# Patient Record
Sex: Female | Born: 1935 | Race: White | Hispanic: No | Smoking: Former smoker
Health system: Southern US, Community
[De-identification: ages and names within clinical notes are randomized; demographics above are authoritative.]

## PROBLEM LIST (undated history)

## (undated) DIAGNOSIS — H409 Unspecified glaucoma: Secondary | ICD-10-CM

## (undated) DIAGNOSIS — D649 Anemia, unspecified: Secondary | ICD-10-CM

## (undated) DIAGNOSIS — E079 Disorder of thyroid, unspecified: Secondary | ICD-10-CM

## (undated) DIAGNOSIS — I739 Peripheral vascular disease, unspecified: Secondary | ICD-10-CM

## (undated) HISTORY — PX: THYROID SURGERY: SHX805

## (undated) HISTORY — PX: EYE SURGERY: SHX253

## (undated) HISTORY — PX: CARDIAC SURGERY: SHX584

## (undated) HISTORY — PX: HIP SURGERY: SHX245

---

## 2019-04-01 ENCOUNTER — Encounter: Payer: Self-pay | Admitting: Emergency Medicine

## 2019-04-01 ENCOUNTER — Other Ambulatory Visit: Payer: Self-pay

## 2019-04-01 ENCOUNTER — Emergency Department: Payer: Medicare Other

## 2019-04-01 DIAGNOSIS — Z79890 Hormone replacement therapy (postmenopausal): Secondary | ICD-10-CM

## 2019-04-01 DIAGNOSIS — F419 Anxiety disorder, unspecified: Secondary | ICD-10-CM | POA: Diagnosis present

## 2019-04-01 DIAGNOSIS — D509 Iron deficiency anemia, unspecified: Secondary | ICD-10-CM | POA: Diagnosis present

## 2019-04-01 DIAGNOSIS — I10 Essential (primary) hypertension: Secondary | ICD-10-CM | POA: Diagnosis present

## 2019-04-01 DIAGNOSIS — Z91018 Allergy to other foods: Secondary | ICD-10-CM

## 2019-04-01 DIAGNOSIS — Z7951 Long term (current) use of inhaled steroids: Secondary | ICD-10-CM

## 2019-04-01 DIAGNOSIS — Z96642 Presence of left artificial hip joint: Secondary | ICD-10-CM | POA: Diagnosis present

## 2019-04-01 DIAGNOSIS — S72115A Nondisplaced fracture of greater trochanter of left femur, initial encounter for closed fracture: Secondary | ICD-10-CM | POA: Diagnosis not present

## 2019-04-01 DIAGNOSIS — I739 Peripheral vascular disease, unspecified: Secondary | ICD-10-CM | POA: Diagnosis present

## 2019-04-01 DIAGNOSIS — Z955 Presence of coronary angioplasty implant and graft: Secondary | ICD-10-CM

## 2019-04-01 DIAGNOSIS — N3281 Overactive bladder: Secondary | ICD-10-CM | POA: Diagnosis present

## 2019-04-01 DIAGNOSIS — W010XXA Fall on same level from slipping, tripping and stumbling without subsequent striking against object, initial encounter: Secondary | ICD-10-CM | POA: Diagnosis present

## 2019-04-01 DIAGNOSIS — D638 Anemia in other chronic diseases classified elsewhere: Secondary | ICD-10-CM | POA: Diagnosis present

## 2019-04-01 DIAGNOSIS — Z7401 Bed confinement status: Secondary | ICD-10-CM

## 2019-04-01 DIAGNOSIS — Z87891 Personal history of nicotine dependence: Secondary | ICD-10-CM

## 2019-04-01 DIAGNOSIS — F0390 Unspecified dementia without behavioral disturbance: Secondary | ICD-10-CM | POA: Diagnosis present

## 2019-04-01 DIAGNOSIS — Z20828 Contact with and (suspected) exposure to other viral communicable diseases: Secondary | ICD-10-CM | POA: Diagnosis present

## 2019-04-01 DIAGNOSIS — J449 Chronic obstructive pulmonary disease, unspecified: Secondary | ICD-10-CM | POA: Diagnosis present

## 2019-04-01 DIAGNOSIS — H409 Unspecified glaucoma: Secondary | ICD-10-CM | POA: Diagnosis present

## 2019-04-01 DIAGNOSIS — S32592A Other specified fracture of left pubis, initial encounter for closed fracture: Secondary | ICD-10-CM | POA: Diagnosis present

## 2019-04-01 DIAGNOSIS — M9702XA Periprosthetic fracture around internal prosthetic left hip joint, initial encounter: Secondary | ICD-10-CM | POA: Diagnosis present

## 2019-04-01 DIAGNOSIS — E039 Hypothyroidism, unspecified: Secondary | ICD-10-CM | POA: Diagnosis present

## 2019-04-01 DIAGNOSIS — K219 Gastro-esophageal reflux disease without esophagitis: Secondary | ICD-10-CM | POA: Diagnosis present

## 2019-04-01 DIAGNOSIS — Z7902 Long term (current) use of antithrombotics/antiplatelets: Secondary | ICD-10-CM

## 2019-04-01 DIAGNOSIS — H9190 Unspecified hearing loss, unspecified ear: Secondary | ICD-10-CM | POA: Diagnosis present

## 2019-04-01 DIAGNOSIS — Z79899 Other long term (current) drug therapy: Secondary | ICD-10-CM

## 2019-04-01 DIAGNOSIS — Z881 Allergy status to other antibiotic agents status: Secondary | ICD-10-CM

## 2019-04-01 MED ORDER — OXYCODONE HCL 5 MG PO TABS
5.0000 mg | ORAL_TABLET | Freq: Once | ORAL | Status: AC
  Administered 2019-04-01: 5 mg via ORAL
  Filled 2019-04-01: qty 1

## 2019-04-01 NOTE — ED Triage Notes (Signed)
Patient states that she lost her balance and fell on her left hip. Patient with complaint of pain to left hip with movement.

## 2019-04-02 ENCOUNTER — Other Ambulatory Visit: Payer: Self-pay

## 2019-04-02 ENCOUNTER — Inpatient Hospital Stay: Payer: Medicare Other

## 2019-04-02 ENCOUNTER — Emergency Department: Payer: Medicare Other

## 2019-04-02 ENCOUNTER — Inpatient Hospital Stay
Admission: EM | Admit: 2019-04-02 | Discharge: 2019-04-05 | DRG: 964 | Disposition: A | Discharge status: Home Health | Payer: Medicare Other | Attending: Internal Medicine | Admitting: Internal Medicine

## 2019-04-02 DIAGNOSIS — K219 Gastro-esophageal reflux disease without esophagitis: Secondary | ICD-10-CM | POA: Diagnosis present

## 2019-04-02 DIAGNOSIS — R52 Pain, unspecified: Secondary | ICD-10-CM

## 2019-04-02 DIAGNOSIS — Z20828 Contact with and (suspected) exposure to other viral communicable diseases: Secondary | ICD-10-CM | POA: Diagnosis present

## 2019-04-02 DIAGNOSIS — D649 Anemia, unspecified: Secondary | ICD-10-CM

## 2019-04-02 DIAGNOSIS — W19XXXA Unspecified fall, initial encounter: Secondary | ICD-10-CM

## 2019-04-02 DIAGNOSIS — Z881 Allergy status to other antibiotic agents status: Secondary | ICD-10-CM | POA: Diagnosis not present

## 2019-04-02 DIAGNOSIS — J449 Chronic obstructive pulmonary disease, unspecified: Secondary | ICD-10-CM | POA: Diagnosis present

## 2019-04-02 DIAGNOSIS — Z955 Presence of coronary angioplasty implant and graft: Secondary | ICD-10-CM | POA: Diagnosis not present

## 2019-04-02 DIAGNOSIS — H409 Unspecified glaucoma: Secondary | ICD-10-CM

## 2019-04-02 DIAGNOSIS — F419 Anxiety disorder, unspecified: Secondary | ICD-10-CM

## 2019-04-02 DIAGNOSIS — S72002D Fracture of unspecified part of neck of left femur, subsequent encounter for closed fracture with routine healing: Secondary | ICD-10-CM | POA: Diagnosis not present

## 2019-04-02 DIAGNOSIS — E039 Hypothyroidism, unspecified: Secondary | ICD-10-CM

## 2019-04-02 DIAGNOSIS — S72002A Fracture of unspecified part of neck of left femur, initial encounter for closed fracture: Secondary | ICD-10-CM | POA: Diagnosis present

## 2019-04-02 DIAGNOSIS — Z96642 Presence of left artificial hip joint: Secondary | ICD-10-CM | POA: Diagnosis present

## 2019-04-02 DIAGNOSIS — W010XXA Fall on same level from slipping, tripping and stumbling without subsequent striking against object, initial encounter: Secondary | ICD-10-CM | POA: Diagnosis present

## 2019-04-02 DIAGNOSIS — S3282XA Multiple fractures of pelvis without disruption of pelvic ring, initial encounter for closed fracture: Secondary | ICD-10-CM

## 2019-04-02 DIAGNOSIS — S32592A Other specified fracture of left pubis, initial encounter for closed fracture: Secondary | ICD-10-CM | POA: Diagnosis present

## 2019-04-02 DIAGNOSIS — Z79890 Hormone replacement therapy (postmenopausal): Secondary | ICD-10-CM | POA: Diagnosis not present

## 2019-04-02 DIAGNOSIS — S72115A Nondisplaced fracture of greater trochanter of left femur, initial encounter for closed fracture: Secondary | ICD-10-CM | POA: Diagnosis present

## 2019-04-02 DIAGNOSIS — F0390 Unspecified dementia without behavioral disturbance: Secondary | ICD-10-CM | POA: Diagnosis present

## 2019-04-02 DIAGNOSIS — Z91018 Allergy to other foods: Secondary | ICD-10-CM | POA: Diagnosis not present

## 2019-04-02 DIAGNOSIS — R2689 Other abnormalities of gait and mobility: Secondary | ICD-10-CM

## 2019-04-02 DIAGNOSIS — N3281 Overactive bladder: Secondary | ICD-10-CM

## 2019-04-02 DIAGNOSIS — Z79899 Other long term (current) drug therapy: Secondary | ICD-10-CM | POA: Diagnosis not present

## 2019-04-02 DIAGNOSIS — Z7189 Other specified counseling: Secondary | ICD-10-CM | POA: Diagnosis not present

## 2019-04-02 DIAGNOSIS — I739 Peripheral vascular disease, unspecified: Secondary | ICD-10-CM | POA: Diagnosis present

## 2019-04-02 DIAGNOSIS — Z7902 Long term (current) use of antithrombotics/antiplatelets: Secondary | ICD-10-CM | POA: Diagnosis not present

## 2019-04-02 DIAGNOSIS — I10 Essential (primary) hypertension: Secondary | ICD-10-CM

## 2019-04-02 DIAGNOSIS — M9702XA Periprosthetic fracture around internal prosthetic left hip joint, initial encounter: Secondary | ICD-10-CM | POA: Diagnosis present

## 2019-04-02 DIAGNOSIS — H9190 Unspecified hearing loss, unspecified ear: Secondary | ICD-10-CM | POA: Diagnosis present

## 2019-04-02 DIAGNOSIS — R0902 Hypoxemia: Secondary | ICD-10-CM

## 2019-04-02 DIAGNOSIS — D638 Anemia in other chronic diseases classified elsewhere: Secondary | ICD-10-CM | POA: Diagnosis present

## 2019-04-02 DIAGNOSIS — Z7951 Long term (current) use of inhaled steroids: Secondary | ICD-10-CM | POA: Diagnosis not present

## 2019-04-02 DIAGNOSIS — D509 Iron deficiency anemia, unspecified: Secondary | ICD-10-CM | POA: Diagnosis present

## 2019-04-02 HISTORY — DX: Anemia, unspecified: D64.9

## 2019-04-02 HISTORY — DX: Peripheral vascular disease, unspecified: I73.9

## 2019-04-02 HISTORY — DX: Unspecified glaucoma: H40.9

## 2019-04-02 HISTORY — DX: Disorder of thyroid, unspecified: E07.9

## 2019-04-02 LAB — CBC WITH DIFFERENTIAL/PLATELET
Abs Immature Granulocytes: 0.08 10*3/uL — ABNORMAL HIGH (ref 0.00–0.07)
Basophils Absolute: 0 10*3/uL (ref 0.0–0.1)
Basophils Relative: 0 %
Eosinophils Absolute: 0.2 10*3/uL (ref 0.0–0.5)
Eosinophils Relative: 1 %
HCT: 33.2 % — ABNORMAL LOW (ref 36.0–46.0)
Hemoglobin: 10.1 g/dL — ABNORMAL LOW (ref 12.0–15.0)
Immature Granulocytes: 1 %
Lymphocytes Relative: 3 %
Lymphs Abs: 0.4 10*3/uL — ABNORMAL LOW (ref 0.7–4.0)
MCH: 23.6 pg — ABNORMAL LOW (ref 26.0–34.0)
MCHC: 30.4 g/dL (ref 30.0–36.0)
MCV: 77.6 fL — ABNORMAL LOW (ref 80.0–100.0)
Monocytes Absolute: 1 10*3/uL (ref 0.1–1.0)
Monocytes Relative: 9 %
Neutro Abs: 10 10*3/uL — ABNORMAL HIGH (ref 1.7–7.7)
Neutrophils Relative %: 86 %
Platelets: 241 10*3/uL (ref 150–400)
RBC: 4.28 MIL/uL (ref 3.87–5.11)
RDW: 21.1 % — ABNORMAL HIGH (ref 11.5–15.5)
Smear Review: NORMAL
WBC: 11.6 10*3/uL — ABNORMAL HIGH (ref 4.0–10.5)
nRBC: 0 % (ref 0.0–0.2)

## 2019-04-02 LAB — BASIC METABOLIC PANEL
Anion gap: 7 (ref 5–15)
Anion gap: 8 (ref 5–15)
BUN: 24 mg/dL — ABNORMAL HIGH (ref 8–23)
BUN: 24 mg/dL — ABNORMAL HIGH (ref 8–23)
CO2: 28 mmol/L (ref 22–32)
CO2: 26 mmol/L (ref 22–32)
Calcium: 8.5 mg/dL — ABNORMAL LOW (ref 8.9–10.3)
Calcium: 8.4 mg/dL — ABNORMAL LOW (ref 8.9–10.3)
Chloride: 104 mmol/L (ref 98–111)
Chloride: 106 mmol/L (ref 98–111)
Creatinine, Ser: 0.76 mg/dL (ref 0.44–1.00)
Creatinine, Ser: 0.81 mg/dL (ref 0.44–1.00)
GFR calc Af Amer: >60 mL/min (ref >60)
GFR calc Af Amer: >60 mL/min (ref >60)
GFR calc non Af Amer: >60 mL/min (ref >60)
GFR calc non Af Amer: >60 mL/min (ref >60)
Glucose, Bld: 138 mg/dL — ABNORMAL HIGH (ref 70–99)
Glucose, Bld: 120 mg/dL — ABNORMAL HIGH (ref 70–99)
Potassium: 5.4 mmol/L — ABNORMAL HIGH (ref 3.5–5.1)
Potassium: 4.4 mmol/L (ref 3.5–5.1)
Sodium: 139 mmol/L (ref 135–145)
Sodium: 140 mmol/L (ref 135–145)

## 2019-04-02 LAB — CBC
HCT: 32.2 % — ABNORMAL LOW (ref 36.0–46.0)
Hemoglobin: 9.5 g/dL — ABNORMAL LOW (ref 12.0–15.0)
MCH: 24 pg — ABNORMAL LOW (ref 26.0–34.0)
MCHC: 29.5 g/dL — ABNORMAL LOW (ref 30.0–36.0)
MCV: 81.3 fL (ref 80.0–100.0)
Platelets: 230 10*3/uL (ref 150–400)
RBC: 3.96 MIL/uL (ref 3.87–5.11)
RDW: 20.6 % — ABNORMAL HIGH (ref 11.5–15.5)
WBC: 10.5 10*3/uL (ref 4.0–10.5)
nRBC: 0 % (ref 0.0–0.2)

## 2019-04-02 LAB — COMPREHENSIVE METABOLIC PANEL
ALT: 15 U/L (ref 0–44)
AST: 22 U/L (ref 15–41)
Albumin: 3.8 g/dL (ref 3.5–5.0)
Alkaline Phosphatase: 115 U/L (ref 38–126)
Anion gap: 11 (ref 5–15)
BUN: 25 mg/dL — ABNORMAL HIGH (ref 8–23)
CO2: 23 mmol/L (ref 22–32)
Calcium: 8.7 mg/dL — ABNORMAL LOW (ref 8.9–10.3)
Chloride: 103 mmol/L (ref 98–111)
Creatinine, Ser: 0.74 mg/dL (ref 0.44–1.00)
GFR calc Af Amer: >60 mL/min (ref >60)
GFR calc non Af Amer: >60 mL/min (ref >60)
Glucose, Bld: 148 mg/dL — ABNORMAL HIGH (ref 70–99)
Potassium: 4.3 mmol/L (ref 3.5–5.1)
Sodium: 137 mmol/L (ref 135–145)
Total Bilirubin: 0.5 mg/dL (ref 0.3–1.2)
Total Protein: 6.8 g/dL (ref 6.5–8.1)

## 2019-04-02 LAB — GLUCOSE, CAPILLARY: Glucose-Capillary: 115 mg/dL — ABNORMAL HIGH (ref 70–99)

## 2019-04-02 LAB — SARS CORONAVIRUS 2 (TAT 6-24 HRS): SARS Coronavirus 2: NEGATIVE

## 2019-04-02 LAB — POTASSIUM: Potassium: 4.3 mmol/L (ref 3.5–5.1)

## 2019-04-02 MED ORDER — PAROXETINE HCL 20 MG PO TABS
40.0000 mg | ORAL_TABLET | ORAL | Status: DC
  Administered 2019-04-02 – 2019-04-05 (×4): 40 mg via ORAL
  Filled 2019-04-02 (×4): qty 2

## 2019-04-02 MED ORDER — ADULT MULTIVITAMIN W/MINERALS CH
1.0000 | ORAL_TABLET | Freq: Every day | ORAL | Status: DC
  Administered 2019-04-02 – 2019-04-05 (×4): 1 via ORAL
  Filled 2019-04-02 (×5): qty 1

## 2019-04-02 MED ORDER — VITAMIN D3 25 MCG (1000 UNIT) PO TABS
2000.0000 [IU] | ORAL_TABLET | Freq: Every day | ORAL | Status: DC
  Administered 2019-04-02 – 2019-04-05 (×4): 2000 [IU] via ORAL
  Filled 2019-04-02 (×7): qty 2

## 2019-04-02 MED ORDER — OXYBUTYNIN CHLORIDE ER 5 MG PO TB24
5.0000 mg | ORAL_TABLET | Freq: Every day | ORAL | Status: DC
  Administered 2019-04-02 – 2019-04-04 (×3): 5 mg via ORAL
  Filled 2019-04-02 (×3): qty 1

## 2019-04-02 MED ORDER — BRIMONIDINE TARTRATE 0.15 % OP SOLN
1.0000 [drp] | Freq: Three times a day (TID) | OPHTHALMIC | Status: DC
  Administered 2019-04-02 – 2019-04-05 (×8): 1 [drp] via OPHTHALMIC
  Filled 2019-04-02: qty 5

## 2019-04-02 MED ORDER — PRAMIPEXOLE DIHYDROCHLORIDE 0.25 MG PO TABS
0.1250 mg | ORAL_TABLET | Freq: Three times a day (TID) | ORAL | Status: DC
  Administered 2019-04-03 – 2019-04-05 (×7): 0.125 mg via ORAL
  Filled 2019-04-02 (×13): qty 0.5

## 2019-04-02 MED ORDER — OXYCODONE HCL 5 MG PO TABS: 5.0000 mg | ORAL_TABLET | ORAL | Status: DC | PRN

## 2019-04-02 MED ORDER — FERROUS SULFATE 325 (65 FE) MG PO TABS
325.0000 mg | ORAL_TABLET | Freq: Every day | ORAL | Status: DC
  Administered 2019-04-02 – 2019-04-05 (×4): 325 mg via ORAL
  Filled 2019-04-02 (×4): qty 1

## 2019-04-02 MED ORDER — MORPHINE SULFATE (PF) 2 MG/ML IV SOLN: 1.0000 mg | INTRAVENOUS | Status: DC | PRN

## 2019-04-02 MED ORDER — MORPHINE SULFATE (PF) 4 MG/ML IV SOLN
4.0000 mg | INTRAVENOUS | Status: DC | PRN
  Administered 2019-04-02: 01:00:00 4 mg via INTRAVENOUS
  Filled 2019-04-02: qty 1

## 2019-04-02 MED ORDER — ONDANSETRON HCL 4 MG PO TABS
4.0000 mg | ORAL_TABLET | Freq: Four times a day (QID) | ORAL | Status: DC | PRN
  Filled 2019-04-02: qty 1

## 2019-04-02 MED ORDER — MORPHINE SULFATE (PF) 2 MG/ML IV SOLN: 2.0000 mg | INTRAVENOUS | Status: DC | PRN

## 2019-04-02 MED ORDER — METOPROLOL TARTRATE 25 MG PO TABS
25.0000 mg | ORAL_TABLET | Freq: Two times a day (BID) | ORAL | Status: DC
  Administered 2019-04-02 – 2019-04-05 (×5): 25 mg via ORAL
  Filled 2019-04-02 (×8): qty 1

## 2019-04-02 MED ORDER — MORPHINE SULFATE (PF) 2 MG/ML IV SOLN
2.0000 mg | INTRAVENOUS | Status: DC | PRN
  Administered 2019-04-02 (×2): 2 mg via INTRAVENOUS
  Filled 2019-04-02 (×2): qty 1

## 2019-04-02 MED ORDER — ACETAMINOPHEN 500 MG PO TABS
1000.0000 mg | ORAL_TABLET | Freq: Three times a day (TID) | ORAL | Status: DC
  Administered 2019-04-02 – 2019-04-05 (×10): 1000 mg via ORAL
  Filled 2019-04-02 (×10): qty 2

## 2019-04-02 MED ORDER — NYSTATIN 100000 UNIT/ML MT SUSP
5.0000 mL | Freq: Four times a day (QID) | OROMUCOSAL | Status: DC
  Administered 2019-04-02 – 2019-04-05 (×9): 500000 [IU] via ORAL
  Filled 2019-04-02 (×13): qty 5

## 2019-04-02 MED ORDER — ENOXAPARIN SODIUM 40 MG/0.4ML ~~LOC~~ SOLN
40.0000 mg | SUBCUTANEOUS | Status: DC
  Administered 2019-04-02 – 2019-04-05 (×4): 40 mg via SUBCUTANEOUS
  Filled 2019-04-02 (×4): qty 0.4

## 2019-04-02 MED ORDER — KETOROLAC TROMETHAMINE 30 MG/ML IJ SOLN
15.0000 mg | Freq: Four times a day (QID) | INTRAMUSCULAR | Status: DC | PRN
  Administered 2019-04-02 – 2019-04-04 (×3): 15 mg via INTRAVENOUS
  Filled 2019-04-02 (×3): qty 1

## 2019-04-02 MED ORDER — ONDANSETRON HCL 4 MG/2ML IJ SOLN: 4.0000 mg | Freq: Four times a day (QID) | INTRAMUSCULAR | Status: DC | PRN

## 2019-04-02 MED ORDER — TRAZODONE HCL 50 MG PO TABS
25.0000 mg | ORAL_TABLET | Freq: Every evening | ORAL | Status: DC | PRN
  Administered 2019-04-04 (×2): 25 mg via ORAL
  Filled 2019-04-02: qty 0.5
  Filled 2019-04-02 (×2): qty 1

## 2019-04-02 MED ORDER — MAGNESIUM HYDROXIDE 400 MG/5ML PO SUSP: 30.0000 mL | Freq: Every day | ORAL | Status: DC | PRN

## 2019-04-02 MED ORDER — ALBUTEROL SULFATE (2.5 MG/3ML) 0.083% IN NEBU: 3.0000 mL | INHALATION_SOLUTION | RESPIRATORY_TRACT | Status: DC | PRN

## 2019-04-02 MED ORDER — ACETAMINOPHEN 325 MG PO TABS: 650.0000 mg | ORAL_TABLET | Freq: Four times a day (QID) | ORAL | Status: DC | PRN

## 2019-04-02 MED ORDER — ACETAMINOPHEN 650 MG RE SUPP: 650.0000 mg | Freq: Four times a day (QID) | RECTAL | Status: DC | PRN

## 2019-04-02 MED ORDER — PANTOPRAZOLE SODIUM 40 MG PO TBEC
40.0000 mg | DELAYED_RELEASE_TABLET | Freq: Every day | ORAL | Status: DC
  Administered 2019-04-03 – 2019-04-04 (×2): 40 mg via ORAL
  Filled 2019-04-02 (×5): qty 1

## 2019-04-02 MED ORDER — LORAZEPAM 1 MG PO TABS
1.0000 mg | ORAL_TABLET | Freq: Two times a day (BID) | ORAL | Status: DC | PRN
  Administered 2019-04-03 – 2019-04-05 (×3): 1 mg via ORAL
  Filled 2019-04-02 (×3): qty 1

## 2019-04-02 MED ORDER — TRAMADOL HCL 50 MG PO TABS
50.0000 mg | ORAL_TABLET | Freq: Four times a day (QID) | ORAL | Status: DC | PRN
  Administered 2019-04-02 – 2019-04-05 (×6): 50 mg via ORAL
  Filled 2019-04-02 (×6): qty 1

## 2019-04-02 MED ORDER — SODIUM CHLORIDE 0.9 % IV SOLN: INTRAVENOUS | Status: DC

## 2019-04-02 MED ORDER — SODIUM CHLORIDE 0.9 % IV SOLN
INTRAVENOUS | Status: DC
  Administered 2019-04-02 – 2019-04-03 (×3): via INTRAVENOUS

## 2019-04-02 MED ORDER — PRAVASTATIN SODIUM 20 MG PO TABS
40.0000 mg | ORAL_TABLET | Freq: Every day | ORAL | Status: DC
  Administered 2019-04-02 – 2019-04-05 (×4): 40 mg via ORAL
  Filled 2019-04-02 (×2): qty 2
  Filled 2019-04-02: qty 1
  Filled 2019-04-02: qty 2

## 2019-04-02 MED ORDER — BISACODYL 10 MG RE SUPP
10.0000 mg | Freq: Every day | RECTAL | Status: DC | PRN
  Filled 2019-04-02: qty 1

## 2019-04-02 MED ORDER — LEVOTHYROXINE SODIUM 100 MCG PO TABS
100.0000 ug | ORAL_TABLET | Freq: Every day | ORAL | Status: DC
  Administered 2019-04-02 – 2019-04-05 (×4): 100 ug via ORAL
  Filled 2019-04-02: qty 2
  Filled 2019-04-02 (×4): qty 1

## 2019-04-02 MED ORDER — ONDANSETRON HCL 4 MG/2ML IJ SOLN
4.0000 mg | Freq: Once | INTRAMUSCULAR | Status: AC | PRN
  Administered 2019-04-02: 01:00:00 4 mg via INTRAVENOUS
  Filled 2019-04-02: qty 2

## 2019-04-02 MED ORDER — CLOPIDOGREL BISULFATE 75 MG PO TABS: 75.0000 mg | ORAL_TABLET | Freq: Every day | ORAL | Status: DC

## 2019-04-02 MED ORDER — CYCLOBENZAPRINE HCL 10 MG PO TABS
10.0000 mg | ORAL_TABLET | Freq: Three times a day (TID) | ORAL | Status: DC | PRN
  Administered 2019-04-02 – 2019-04-05 (×2): 10 mg via ORAL
  Filled 2019-04-02 (×2): qty 1

## 2019-04-02 MED ORDER — LORAZEPAM 1 MG PO TABS
1.0000 mg | ORAL_TABLET | Freq: Two times a day (BID) | ORAL | Status: DC
  Administered 2019-04-02: 08:00:00 1 mg via ORAL
  Filled 2019-04-02: qty 1

## 2019-04-02 MED ORDER — MELATONIN 5 MG PO TABS
2.5000 mg | ORAL_TABLET | Freq: Every day | ORAL | Status: DC
  Filled 2019-04-02: qty 0.5

## 2019-04-02 MED ORDER — LATANOPROST 0.005 % OP SOLN
1.0000 [drp] | Freq: Every day | OPHTHALMIC | Status: DC
  Administered 2019-04-03 – 2019-04-04 (×2): 1 [drp] via OPHTHALMIC
  Filled 2019-04-02 (×2): qty 2.5

## 2019-04-02 MED ORDER — OXYCODONE HCL 5 MG PO TABS: 5.0000 mg | ORAL_TABLET | Freq: Four times a day (QID) | ORAL | Status: DC | PRN

## 2019-04-02 NOTE — ED Notes (Signed)
Pt given lunch tray, family at bedside

## 2019-04-02 NOTE — ED Notes (Signed)
Pt's sats noted to drop into mid to upper 80's after morphine; placed on 2L via [place]; sats rebounded to 95%; continues to c/o no pain at rest; daughter at bedside

## 2019-04-02 NOTE — ED Notes (Signed)
Pt back from CT

## 2019-04-02 NOTE — ED Notes (Signed)
Dr Forbach in to follow up 

## 2019-04-02 NOTE — ED Notes (Signed)
Pt to CT via stretcher

## 2019-04-02 NOTE — Progress Notes (Signed)
Patient admitted from ED via stretcher. Daughter present with patient.  Patient squeeling and yelling. Gripping bed, not letting go, had to peel fingers off bed rails.  Patient pulled IV out, Patient pulled Purewick out and threw tubing across room. Patient removed two pillows from behind her head and threw them across the room. Patient his this scriber x2 with her left arm and hit the tech x 1 with her right arm. Daughter continues to be present.

## 2019-04-02 NOTE — Significant Event (Signed)
Rapid Response Event Note  Overview: Time Called: 4481 Arrival Time: 1543 Event Type: Other (Comment)(change in level of responsiveness)  Initial Focused Assessment: Arrived in room where patient appeared to be sleeping comfortably in the bed in her home nightgown with daughter and several staff at bedside. Patient was very difficult to arouse per report where earlier she had been angry and agitated. Vital signs: HR 104 regular, pulses +2, BP 147/71, MAP 95, oxygen saturation 100% on room air, RR 18 and unlabored. CBG 115. Patient would not arouse to voice (hard of hearing per daughter but hearing aides in place). Would arouse to sternal rub but not to fingerstick for CBG. Per report and personal review of MAR patient had not received any sedating medications in hours. Daughter declined offer to administer narcan to patient.  Interventions: Spoke with patient's RN and recommend head CT since patient did have a fall where her admitting fracture was obtained.   Plan of Care (if not transferred): Patient's RN to get in touch with MD and recommend head CT. Current plan to remain on 1A for now. Patient's daughter in agreement with current plan.  Event Summary:   at      at    Outcome: Stayed in room and stabalized  Event End Time: [place], [place]

## 2019-04-02 NOTE — ED Provider Notes (Signed)
[facility] Emergency Department Provider Note  ____________________________________________   First MD Initiated Contact with Patient 04/02/19 0028     (approximate)  I have reviewed the triage vital signs and the nursing notes.   HISTORY  Chief Complaint Hip Pain and Fall    HPI Pamela Dickerson is a 85 y.o. female with a prior left hip replacement that her daughter believes was about 2 years ago.  She presents tonight for evaluation of left hip pain occurring after a fall.  She reports that she has chronic balance issues and tonight she simply lost her balance and fell directly onto her left hip.  She had acute onset of severe pain.  The pain is moderate when she is at rest but severe when she moves around.  No numbness nor tingling in the leg.  She did not strike her head.  She denies headache, neck pain, chest pain, shortness of breath, cough, nausea, vomiting, and abdominal pain.  She did not sustain any injuries to her arms nor her legs.  She is unable to bear any weight on the left leg.  She takes Plavix and up until the last few days she was also taking a daily baby aspirin.           Past Medical History:  Diagnosis Date  . Anemia   . Glaucoma   . PAD (peripheral artery disease) ([place])   . Thyroid disease     Patient Active Problem List   Diagnosis Date Noted  . Closed left hip fracture ([place]) 04/02/2019    Past Surgical History:  Procedure Laterality Date  . CARDIAC SURGERY     cardiac stent  . EYE SURGERY    . HIP SURGERY    . THYROID SURGERY      Prior to Admission medications   Medication Sig Start Date End Date Taking? Authorizing Provider  acetaminophen (TYLENOL) 650 MG CR tablet Scheduled BID and up to another 2 X daily as needed for pain 02/01/17  Yes [provider]  albuterol (VENTOLIN HFA) 108 (90 Base) MCG/ACT inhaler Inhale into the lungs. 11/06/18 11/06/19 Yes [provider]  brimonidine (ALPHAGAN) 0.15 %  ophthalmic solution Administer 1 drop into the left eye Two (2) times a day. 10/17/18  Yes [provider]  Carboxymethylcellulose Sodium (REFRESH LIQUIGEL) 1 % GEL Administer 1 drop to both eyes Three (3) times a day. 02/01/17  Yes [provider]  clopidogrel (PLAVIX) 75 MG tablet Take by mouth. 03/24/18  Yes [provider]  Fluticasone-Salmeterol (ADVAIR DISKUS) 250-50 MCG/DOSE AEPB Inhale into the lungs. 04/29/10  Yes [provider]  levothyroxine (SYNTHROID) 100 MCG tablet Take by mouth. 05/13/10 11/14/19 Yes [provider]  LORazepam (ATIVAN) 1 MG tablet 1/2 to 1 tablet every 8 hours prn (max total daily dose 2.5 mg) 02/02/19  Yes [provider]  metoprolol succinate (TOPROL-XL) 50 MG 24 hr tablet Take by mouth. 05/13/10  Yes [provider]  nitroGLYCERIN (NITROSTAT) 0.4 MG SL tablet Place under the tongue. 11/23/18 11/23/19 Yes [provider]  nystatin (MYCOSTATIN) 100000 UNIT/ML suspension Take by mouth. 09/08/18  Yes [provider]  omeprazole (PRILOSEC) 20 MG capsule Take by mouth. 04/29/10  Yes [provider]  oxybutynin (DITROPAN-XL) 5 MG 24 hr tablet Take by mouth. 03/24/18  Yes [provider]  PARoxetine (PAXIL) 40 MG tablet Take by mouth. 05/13/10  Yes [provider]  pramipexole (MIRAPEX) 0.125 MG tablet Take by mouth. 03/30/19  03/29/20 Yes [provider]  pravastatin (PRAVACHOL) 40 MG tablet Take by mouth. 02/23/19  Yes [provider]  Travoprost, BAK Free, (TRAVATAN) 0.004 % SOLN ophthalmic solution INSTILL 1 DROP IN BOTH EYES EVERY EVENING 10/03/18  Yes [provider]  Cholecalciferol (VITAMIN D) 50 MCG (2000 UT) tablet Take by mouth.    [provider]  Melatonin 3 MG CAPS Take by mouth.    [provider]  Multiple Vitamins-Minerals (MULTIVITAMIN WITH MINERALS) tablet Take by mouth.    [provider]    Allergies  Amoxicillin, Beef-derived products, and Pork-derived products  No family history on file.  Social History Social History   Tobacco Use  . Smoking status: Former Games developer  . Smokeless tobacco: Never Used  Substance Use Topics  . Alcohol use: Yes    Comment: rarley  . Drug use: Never    Review of Systems Constitutional: No fever/chills Eyes: No visual changes. ENT: No sore throat. Cardiovascular: Denies chest pain. Respiratory: Denies shortness of breath. Gastrointestinal: No abdominal pain.  No nausea, no vomiting.  No diarrhea.  No constipation. Genitourinary: Negative for dysuria. Musculoskeletal: Acute onset severe pain in the left hip as described above after a mechanical fall.  No other abnormalities or acute injuries. Integumentary: Negative for rash. Neurological: Negative for headaches, focal weakness or numbness.   ____________________________________________   PHYSICAL EXAM:  VITAL SIGNS: ED Triage Vitals  Enc Vitals Group     BP 04/01/19 1942 (!) 127/57     Pulse Rate 04/01/19 1942 93     Resp 04/01/19 1942 18     Temp 04/01/19 1942 97.7 F (36.5 C)     Temp Source 04/01/19 1942 Oral     SpO2 04/01/19 1942 93 %     Weight 04/01/19 1943 59.9 kg (132 lb)     Height 04/01/19 1943 1.524 m (5')     Head Circumference --      Peak Flow --      Pain Score 04/01/19 1943 0     Pain Loc --      Pain Edu? --      Excl. in GC? --     Constitutional: Alert and oriented.  Appears uncomfortable but is not in severe distress.  Severely hard of hearing. Eyes: Conjunctivae are normal.  Head: Atraumatic. Nose: No congestion/rhinnorhea. Mouth/Throat: Patient is wearing a mask. Neck: No stridor.  No meningeal signs.   Cardiovascular: Normal rate, regular rhythm. Good peripheral circulation. Grossly normal heart sounds. Respiratory: Normal respiratory effort.  No retractions. Gastrointestinal: Soft and nontender. No distention.  Musculoskeletal: No gross deformities  of extremities.  The left hip is tender to palpation and the patient has severe pain that makes her cry out with any amount of movement of the hip or attempts at weightbearing.  Neurovascularly intact. Neurologic:  Normal speech and language. No gross focal neurologic deficits are appreciated.  Skin:  Skin is warm, dry and intact. Psychiatric: Mood and affect are normal. Speech and behavior are normal.  ____________________________________________   LABS (all labs ordered are listed, but only abnormal results are displayed)  Labs Reviewed  COMPREHENSIVE METABOLIC PANEL - Abnormal; Notable for the following components:      Result Value   Glucose, Bld 148 (*)    BUN 25 (*)    Calcium 8.7 (*)    All other components within normal limits  CBC WITH DIFFERENTIAL/PLATELET - Abnormal; Notable for the following components:   WBC 11.6 (*)  Hemoglobin 10.1 (*)    HCT 33.2 (*)    MCV 77.6 (*)    MCH 23.6 (*)    RDW 21.1 (*)    All other components within normal limits  SARS CORONAVIRUS 2 (TAT 6-24 HRS)  BASIC METABOLIC PANEL  CBC  TYPE AND SCREEN   ____________________________________________  EKG  ED ECG REPORT I, Loleta Roseory Dolph Tavano, the attending physician, personally viewed and interpreted this ECG.  Date: 04/02/2019 EKG Time: 03:01 Rate: 91 Rhythm: normal sinus rhythm QRS Axis: normal Intervals: normal ST/T Wave abnormalities: Non-specific ST segment / T-wave changes, but no clear evidence of acute ischemia. Narrative Interpretation: no definitive evidence of acute ischemia; does not meet STEMI criteria.   ____________________________________________  RADIOLOGY I, Loleta Roseory Godson Pollan, personally discussed these images and results by phone with the on-call radiologist and used this discussion as part of my medical decision making.  I also viewed the images myself.   ED MD interpretation:  non-displaced periprosthetic fracture of the left greater trochanter, possible superior and  inferior left-sided pubic rami fractures as well.  Official radiology report(s): Ct Hip Left Wo Contrast  Result Date: 04/02/2019 CLINICAL DATA:  Recent fall with periprosthetic fracture on recent plain film EXAM: CT OF THE LEFT HIP WITHOUT CONTRAST TECHNIQUE: Multidetector CT imaging of the left hip was performed according to the standard protocol. Multiplanar CT image reconstructions were also generated. COMPARISON:  Plain film from the previous day FINDINGS: Bones/Joint/Cartilage Changes consistent with left hip replacement are noted. There is a periprosthetic fracture in the proximal femur without significant displacement. Suggestion of undisplaced fractures of the left superior and inferior pubic rami are seen as well. Ligaments Suboptimally assessed by CT. Muscles and Tendons Surrounding musculature is within normal limits. Soft tissues Focal hematoma is noted adjacent to the greater trochanter laterally related to the recent injury. IMPRESSION: Undisplaced periprosthetic fracture similar to that seen on prior plain film examination. There is also suspicion for superior and inferior pubic rami fractures without significant displacement. Electronically Signed   By: Alcide CleverMark  Lukens M.D.   On: 04/02/2019 01:43   Dg Chest Port 1 View  Result Date: 04/02/2019 CLINICAL DATA:  Preop EXAM: PORTABLE CHEST 1 VIEW COMPARISON:  None. FINDINGS: The heart size and mediastinal contours are within normal limits. Aortic knob calcifications are seen. Mildly increased interstitial markings seen at both lung bases as on the prior exam. No acute osseous abnormality. IMPRESSION: No acute cardiopulmonary process. Electronically Signed   By: Jonna ClarkBindu  Avutu M.D.   On: 04/02/2019 00:48   Dg Hip Unilat With Pelvis 2-3 Views Left  Result Date: 04/01/2019 CLINICAL DATA:  Hip pain, fall EXAM: DG HIP (WITH OR WITHOUT PELVIS) 2-3V LEFT COMPARISON:  Radiograph 03/10/2017 FINDINGS: The osseous structures appear diffusely demineralized  which may limit detection of small or nondisplaced fractures. There are postsurgical changes from prior left hip hemiarthroplasty in expected alignment when compared to prior radiographs. There is a questionable fracture lucency through the greater trochanter which may reflect a nondisplaced periprosthetic fracture with overlying soft tissue swelling and thickening. Bones of the pelvis are congruent. Arcuate lines are congruent. Right femoral head is normally located. Atherosclerotic calcifications are noted in the pelvis. IMPRESSION: 1. Left hip hemiarthroplasty with possible nondisplaced periprosthetic fracture through the greater trochanter. 2. Adjacent soft tissue swelling and possible hematoma. 3. Diffuse bony demineralization which may limit detection of additional subtle nondisplaced fractures. Electronically Signed   By: Kreg ShropshirePrice  DeHay M.D.   On: 04/01/2019 20:27    ____________________________________________  PROCEDURES   Procedure(s) performed (including Critical Care):  Procedures   ____________________________________________   INITIAL IMPRESSION / MDM / ASSESSMENT AND PLAN / ED COURSE  As part of my medical decision making, I reviewed the following data within the electronic MEDICAL RECORD NUMBER History obtained from family, Nursing notes reviewed and incorporated, Labs reviewed , EKG interpreted , Old chart reviewed, Radiograph reviewed , Discussed with orthopedics (Dr. Joice Lofts), Discussed with admitting physician (Dr. Arville Care), Discussed with radiologist and Notes from prior ED visits   Differential diagnosis includes, but is not limited to, hip fracture, hardware malfunction or displacement, musculoskeletal strain/sprain, pelvic fracture.  The patient has localized pain to her left hip where she has existing hardware.  The x-ray demonstrates what appears to be a nondisplaced fracture through the greater trochanter.  No other acute abnormalities are identified.  I have ordered a CT hip  without contrast after discussing the case with the radiologist on-call who agreed that additional evaluation by CT would be helpful given the existing hardware and that his CT would be better than an MRI.  I will also call to discuss the case by Dr. Joice Lofts with orthopedics.  I am proceeding with the standard preoperative laboratory work-up but I will not order a Covid test until I have spoken with Dr. Joice Lofts.  The patient has no infectious signs or symptoms and no indication of any other injury.      Clinical Course as of Apr 01 734  [facility] Apr 02, 2019  0160 I spoke by phone with Dr. Joice Lofts.  He reviewed the images and states that this appears to not be a surgical case.  He said that he will consult and follow along but given the nature of the injury, the location, and the existing hardware, the patient is unlikely to benefit from surgery.  He agreed with proceeding with a hip CT to rule out any other occult injury that is not visible on the x-ray.  I updated the family.  I will proceed with lab work and imaging as planned.   [CF]  0123 No acute abnormalities on CXR  DG Chest Port 1 View [CF]  0206 Discussed CT hip with Dr. Karle Starch (radiology).  He thinks that small nondisplaced fractures of the superior and inferior pubic rami are possible but he does not recommend any additional imaging at this time since these injuries are also nonoperative.  I updated the family and will now discussed the case with the hospitalist.  The patient will benefit from coming into the hospital for pain control, orthopedics consult (assessment in person to determine if any surgical intervention is recommended), PT/OT consults, and rehab placement.   [CF]  0211 Placed consult to hospitalist team   [CF]  0221 Discussed case with Dr. Arville Care who will admit.   [CF]    Clinical Course User Index [CF] Loleta Rose, MD     ____________________________________________  FINAL CLINICAL IMPRESSION(S) / ED DIAGNOSES  Final  diagnoses:  Fall, initial encounter  Nondisplaced fracture of greater trochanter of left femur, initial encounter for closed fracture (HCC)  Multiple closed fractures of pelvis without disruption of pelvic ring, initial encounter (HCC)  Intractable pain  Unable to bear weight     MEDICATIONS GIVEN DURING THIS VISIT:  Medications  0.9 %  sodium chloride infusion (has no administration in time range)  morphine 4 MG/ML injection 4 mg (4 mg Intravenous Given 04/02/19 0119)  enoxaparin (LOVENOX) injection 40 mg (has no administration in time range)  0.9 %  sodium chloride infusion (has no administration in time range)  acetaminophen (TYLENOL) tablet 650 mg (has no administration in time range)    Or  acetaminophen (TYLENOL) suppository 650 mg (has no administration in time range)  oxyCODONE (Oxy IR/ROXICODONE) immediate release tablet 5 mg (has no administration in time range)  ketorolac (TORADOL) 15 MG/ML injection 15 mg (has no administration in time range)  traZODone (DESYREL) tablet 25 mg (has no administration in time range)  magnesium hydroxide (MILK OF MAGNESIA) suspension 30 mL (has no administration in time range)  bisacodyl (DULCOLAX) suppository 10 mg (has no administration in time range)  ondansetron (ZOFRAN) tablet 4 mg (has no administration in time range)    Or  ondansetron (ZOFRAN) injection 4 mg (has no administration in time range)  morphine 2 MG/ML injection 2 mg (has no administration in time range)  oxyCODONE (Oxy IR/ROXICODONE) immediate release tablet 5 mg (5 mg Oral Given 04/01/19 2259)  ondansetron (ZOFRAN) injection 4 mg (4 mg Intravenous Given 04/02/19 0116)     ED Discharge Orders    None      *Please note:  Sabryn Preslar was evaluated in Emergency Department on 04/02/2019 for the symptoms described in the history of present illness. She was evaluated in the context of the global COVID-19 pandemic, which necessitated consideration that the patient might be at  risk for infection with the SARS-CoV-2 virus that causes COVID-19. Institutional protocols and algorithms that pertain to the evaluation of patients at risk for COVID-19 are in a state of rapid change based on information released by regulatory bodies including the CDC and federal and state organizations. These policies and algorithms were followed during the patient's care in the ED.  Some ED evaluations and interventions may be delayed as a result of limited staffing during the pandemic.*  Note:  This document was prepared using Dragon voice recognition software and may include unintentional dictation errors.   Loleta Rose, MD 04/02/19 [phone]

## 2019-04-02 NOTE — ED Notes (Signed)
Ortho Provider at bedside. 

## 2019-04-02 NOTE — Progress Notes (Signed)
Ch received pg regarding pt that was not responsive but appeared to be sleeping. Pt is a 85 y.o. that has been admitted for a fracture of her hip that will NOT be corrected through surgery. Pt presented to be comfortable with normal vitals after being assessed by the care team. Ch provided compassionate presence for pt's dau at bedside that shared that the pt has a hx of COPD but was not dependent on O2. Pt had recently received medication for pain mngmt. Ch informed staff to pg if further needs arise.    04/02/19 1600  Clinical Encounter Type  Visited With Patient and family together;Health care provider  Visit Type Code;Critical Care  Referral From Nurse  Consult/Referral To Chaplain  Stress Factors  Patient Stress Factors Health changes  Family Stress Factors None identified

## 2019-04-02 NOTE — H&P (Signed)
at [facility]   PATIENT NAME: Pamela DecantDoris Dickerson    MR#:  [id number]  DATE OF BIRTH:  [birth year]  DATE OF ADMISSION:  04/02/2019  PRIMARY CARE PHYSICIAN: Patriciaann ClanButler, Erik, DO   REQUESTING/REFERRING PHYSICIAN: Loleta RoseForbach, Cory, MD  CHIEF COMPLAINT:   Chief Complaint  Patient presents with   Hip Pain   Fall    HISTORY OF PRESENT ILLNESS:  Pamela DecantDoris Dickerson  is a 85 y.o. Caucasian female with a known history of anemia, thyroid disease, glaucoma and peripheral arterial disease, who presented to the emergency room with acute onset of mechanical fall with subsequent left hip pain.  The patient underwent left total hip arthroplasty in the past.  She has been having unsteady gait lately.  Denies any presyncope or syncope.  No headache or dizziness or blurred vision.  No paresthesias or focal muscle weakness.  Most of the history is taken from her daughter as the patient has hearing loss.  Upon presentation to the emergency room vital signs were within normal labs were unremarkable except for mild leukocytosis and anemia for which x-ray showed no acute cardiopulmonary disease.  Left hip x-ray showed left hip hemiarthroplasty and possible nondisplaced periprostatic fracture through the greater trochanter with adjacent soft tissue swelling and possible hematoma and diffuse bony demineralization that may limit detection of additional subtle nondisplaced fracture.  Head CT scan revealed nondisplaced periprostatic fracture through that seen on prior plain film.  There is also question about superior and inferior pubic rami fractures without significant displacement.  The patient was given 4 mg IV morphine sulfate and 4 mg of IV Zofran as well as 5 mg p.o. Roxicodone and was started on IV normal saline at 125 mL/h.  Dr. Joice LoftsPoggi was notified about the patient and is aware.  She will be admitted to a MedSurg bed for further evaluation and management. PAST MEDICAL HISTORY:   Past Medical History:    Diagnosis Date   Anemia    Glaucoma    PAD (peripheral artery disease) (HCC)    Thyroid disease     PAST SURGICAL HISTORY:   Past Surgical History:  Procedure Laterality Date   CARDIAC SURGERY     cardiac stent   EYE SURGERY     HIP SURGERY     THYROID SURGERY      SOCIAL HISTORY:   Social History   Tobacco Use   Smoking status: Former Smoker   Smokeless tobacco: Never Used  Substance Use Topics   Alcohol use: Yes    Comment: rarley    FAMILY HISTORY:  No family history on file.  DRUG ALLERGIES:   Allergies  Allergen Reactions   Amoxicillin    Beef-Derived Products    Pork-Derived Products     REVIEW OF SYSTEMS:   ROS As per history of present illness. All pertinent systems were reviewed above. Constitutional,  HEENT, cardiovascular, respiratory, GI, GU, musculoskeletal, neuro, psychiatric, endocrine,  integumentary and hematologic systems were reviewed and are otherwise  negative/unremarkable except for positive findings mentioned above in the HPI.   MEDICATIONS AT HOME:   Prior to Admission medications   Not on File      VITAL SIGNS:  Blood pressure (!) 113/56, pulse 95, temperature 97.7 F (36.5 C), temperature source Oral, resp. rate 18, height 5' (1.524 m), weight 59.9 kg, SpO2 98 %.  PHYSICAL EXAMINATION:  Physical Exam  GENERAL:  85 y.o.-year-old patient Caucasian female lying in the bed with no acute distress.  EYES: Pupils  equal, round, reactive to light and accommodation. No scleral icterus. Extraocular muscles intact.  HEENT: Head atraumatic, normocephalic. Oropharynx and nasopharynx clear.  NECK:  Supple, no jugular venous distention. No thyroid enlargement, no tenderness.  LUNGS: Normal breath sounds bilaterally, no wheezing, rales,rhonchi or crepitation. No use of accessory muscles of respiration.  CARDIOVASCULAR: Regular rate and rhythm, S1, S2 normal. No murmurs, rubs, or gallops.  ABDOMEN: Soft, nondistended,  nontender. Bowel sounds present. No organomegaly or mass.  EXTREMITIES: No pedal edema, cyanosis, or clubbing.  NEUROLOGIC: Cranial nerves II through XII are intact. Muscle strength 5/5 in all extremities. Sensation intact. Gait not checked.  PSYCHIATRIC: The patient is alert and oriented x 3.  Normal affect and good eye contact. SKIN: No obvious rash, lesion, or ulcer.   LABORATORY PANEL:   CBC Recent Labs  Lab 04/02/19 0100  WBC 11.6*  HGB 10.1*  HCT 33.2*  PLT 241   ------------------------------------------------------------------------------------------------------------------  Chemistries  Recent Labs  Lab 04/02/19 0100  NA 137  K 4.3  CL 103  CO2 23  GLUCOSE 148*  BUN 25*  CREATININE 0.74  CALCIUM 8.7*  AST 22  ALT 15  ALKPHOS 115  BILITOT 0.5   ------------------------------------------------------------------------------------------------------------------  Cardiac Enzymes No results for input(s): TROPONINI in the last 168 hours. ------------------------------------------------------------------------------------------------------------------  RADIOLOGY:  Ct Hip Left Wo Contrast  Result Date: 04/02/2019 CLINICAL DATA:  Recent fall with periprosthetic fracture on recent plain film EXAM: CT OF THE LEFT HIP WITHOUT CONTRAST TECHNIQUE: Multidetector CT imaging of the left hip was performed according to the standard protocol. Multiplanar CT image reconstructions were also generated. COMPARISON:  Plain film from the previous day FINDINGS: Bones/Joint/Cartilage Changes consistent with left hip replacement are noted. There is a periprosthetic fracture in the proximal femur without significant displacement. Suggestion of undisplaced fractures of the left superior and inferior pubic rami are seen as well. Ligaments Suboptimally assessed by CT. Muscles and Tendons Surrounding musculature is within normal limits. Soft tissues Focal hematoma is noted adjacent to the greater  trochanter laterally related to the recent injury. IMPRESSION: Undisplaced periprosthetic fracture similar to that seen on prior plain film examination. There is also suspicion for superior and inferior pubic rami fractures without significant displacement. Electronically Signed   By: Inez Catalina M.D.   On: 04/02/2019 01:43   Dg Chest Port 1 View  Result Date: 04/02/2019 CLINICAL DATA:  Preop EXAM: PORTABLE CHEST 1 VIEW COMPARISON:  None. FINDINGS: The heart size and mediastinal contours are within normal limits. Aortic knob calcifications are seen. Mildly increased interstitial markings seen at both lung bases as on the prior exam. No acute osseous abnormality. IMPRESSION: No acute cardiopulmonary process. Electronically Signed   By: Prudencio Pair M.D.   On: 04/02/2019 00:48   Dg Hip Unilat With Pelvis 2-3 Views Left  Result Date: 04/01/2019 CLINICAL DATA:  Hip pain, fall EXAM: DG HIP (WITH OR WITHOUT PELVIS) 2-3V LEFT COMPARISON:  Radiograph 03/10/2017 FINDINGS: The osseous structures appear diffusely demineralized which may limit detection of small or nondisplaced fractures. There are postsurgical changes from prior left hip hemiarthroplasty in expected alignment when compared to prior radiographs. There is a questionable fracture lucency through the greater trochanter which may reflect a nondisplaced periprosthetic fracture with overlying soft tissue swelling and thickening. Bones of the pelvis are congruent. Arcuate lines are congruent. Right femoral head is normally located. Atherosclerotic calcifications are noted in the pelvis. IMPRESSION: 1. Left hip hemiarthroplasty with possible nondisplaced periprosthetic fracture through the greater trochanter. 2. Adjacent  soft tissue swelling and possible hematoma. 3. Diffuse bony demineralization which may limit detection of additional subtle nondisplaced fractures. Electronically Signed   By: Kreg Shropshire M.D.   On: 04/01/2019 20:27      IMPRESSION AND  PLAN:   1.  Closed left hip fracture status post mechanical fall left hip hemiarthroplasty.  The patient will be admitted to a MedSurg bed.  Pain management will be provided with as needed IV Toradol and morphine sulfate as well as p.o. Roxicodone.  Orthopedic consultation will be obtained by Dr. Joice Lofts who was notified about the patient.  The patient will likely be a nonsurgical candidate.  2.  Hypertension.  We will continue Toprol-XL.  3.  Hypothyroidism.  We will check TSH and continue Synthroid.  4.  GERD.  PPI therapy will be resumed.  5.  Glaucoma.  We will continue ophthalmic GTT.  6.  Overactive bladder.  We will continue Ditropan XL.  7.  Anxiety.  We will continue Ativan  8.  DVT prophylaxis.  Subtenons Lovenox  All the records are reviewed and case discussed with ED provider. The plan of care was discussed in details with the patient (and family). I answered all questions. The patient agreed to proceed with the above mentioned plan. Further management will depend upon hospital course.   CODE STATUS: Full code  TOTAL TIME TAKING CARE OF THIS PATIENT: 55 minutes.    Hannah Beat M.D on 04/02/2019 at 2:29 AM  [facility]   From 7 PM-7 AM, contact night-coverage [email or web address]  CC: Primary care physician; Patriciaann Clan, DO   Note: This dictation was prepared with Dragon dictation along with smaller phrase technology. Any transcriptional errors that result from this process are unintentional.

## 2019-04-02 NOTE — ED Notes (Signed)
Pt asleep, breakfast tray placed in rm. Family at bedside.

## 2019-04-02 NOTE — Consult Note (Signed)
ORTHOPAEDIC CONSULTATION  REQUESTING PHYSICIAN: Pennie Banter, DO  Chief Complaint:   Left hip pain.  History of Present Illness: Pamela Dickerson is a 85 y.o. female with a history of anemia, glaucoma, peripheral artery disease, and hypothyroidism who does have a history of poor balance.  Apparently, the patient was in her usual state of health until last evening when she apparently lost her balance and fell onto her left hip.  She was brought to the emergency room where x-rays demonstrated an essentially nondisplaced greater trochanteric fracture.  The patient denies any associated injuries.  She did not strike her head or lose consciousness.  She also denies any chest pain, shortness of breath, lightheadedness, dizziness, or other symptoms which may have precipitated her fall.  She is now 2 years status post a left hip hemiarthroplasty performed in [facility].  Past Medical History:  Diagnosis Date  . Anemia   . Glaucoma   . PAD (peripheral artery disease) (HCC)   . Thyroid disease    Past Surgical History:  Procedure Laterality Date  . CARDIAC SURGERY     cardiac stent  . EYE SURGERY    . HIP SURGERY    . THYROID SURGERY     Social History   Socioeconomic History  . Marital status: Divorced    Spouse name: Not on file  . Number of children: Not on file  . Years of education: Not on file  . Highest education level: Not on file  Occupational History  . Not on file  Social Needs  . Financial resource strain: Not on file  . Food insecurity    Worry: Not on file    Inability: Not on file  . Transportation needs    Medical: Not on file    Non-medical: Not on file  Tobacco Use  . Smoking status: Former Games developer  . Smokeless tobacco: Never Used  Substance and Sexual Activity  . Alcohol use: Yes    Comment: rarley  . Drug use: Never  . Sexual activity: Not on file  Lifestyle  . Physical activity    Days per  week: Not on file    Minutes per session: Not on file  . Stress: Not on file  Relationships  . Social Musician on phone: Not on file    Gets together: Not on file    Attends religious service: Not on file    Active member of club or organization: Not on file    Attends meetings of clubs or organizations: Not on file    Relationship status: Not on file  Other Topics Concern  . Not on file  Social History Narrative  . Not on file   No family history on file. Allergies  Allergen Reactions  . Amoxicillin   . Beef-Derived Products   . Pork-Derived Products    Prior to Admission medications   Medication Sig Start Date End Date Taking? Authorizing Provider  acetaminophen (TYLENOL) 650 MG CR tablet Scheduled BID and up to another 2 X daily as needed for pain 02/01/17  Yes [provider]  albuterol (VENTOLIN HFA) 108 (90 Base) MCG/ACT inhaler Inhale 1-2 puffs into the lungs every 4 (four) hours as needed.  11/06/18 11/06/19 Yes [provider]  brimonidine (ALPHAGAN) 0.15 % ophthalmic solution Administer 1 drop into the left eye Two (2) times a day. 10/17/18  Yes [provider]  Carboxymethylcellulose Sodium (REFRESH LIQUIGEL) 1 % GEL Administer 1 drop to both eyes  Three (3) times a day. 02/01/17  Yes [provider]  Cholecalciferol (VITAMIN D) 50 MCG (2000 UT) tablet Take 2,000 Units by mouth daily.    Yes [provider]  clopidogrel (PLAVIX) 75 MG tablet Take 75 mg by mouth daily.  03/24/18  Yes [provider]  ferrous sulfate 325 (65 FE) MG EC tablet Take 325 mg by mouth daily with breakfast.   Yes [provider]  Fluticasone-Salmeterol (ADVAIR DISKUS) 250-50 MCG/DOSE AEPB Inhale 1 puff into the lungs daily.  04/29/10  Yes [provider]  levothyroxine (SYNTHROID) 100 MCG tablet Take 100 mcg by mouth daily before breakfast.  05/13/10 11/14/19 Yes [provider]  LORazepam (ATIVAN) 1 MG tablet 1/2  to 1 tablet every 8 hours prn (max total daily dose 2.5 mg) 02/02/19  Yes [provider]  Melatonin 3 MG CAPS Take 3 mg by mouth at bedtime.    Yes [provider]  metoprolol succinate (TOPROL-XL) 50 MG 24 hr tablet Take 50 mg by mouth daily.  05/13/10  Yes [provider]  Multiple Vitamins-Minerals (MULTIVITAMIN WITH MINERALS) tablet Take 1 tablet by mouth daily.    Yes [provider]  nitroGLYCERIN (NITROSTAT) 0.4 MG SL tablet Place 0.4 mg under the tongue every 5 (five) minutes as needed.  11/23/18 11/23/19 Yes [provider]  nystatin (MYCOSTATIN) 100000 UNIT/ML suspension Take 5 mLs by mouth 4 (four) times daily.  09/08/18  Yes [provider]  omeprazole (PRILOSEC) 20 MG capsule Take 20 mg by mouth daily.  04/29/10  Yes [provider]  oxybutynin (DITROPAN-XL) 5 MG 24 hr tablet Take 5 mg by mouth at bedtime.  03/24/18  Yes [provider]  PARoxetine (PAXIL) 40 MG tablet Take 40 mg by mouth every morning.  05/13/10  Yes [provider]  pramipexole (MIRAPEX) 0.125 MG tablet Take 0.125 mg by mouth 3 (three) times daily.  03/30/19 03/29/20 Yes [provider]  pravastatin (PRAVACHOL) 40 MG tablet Take 40 mg by mouth daily.  02/23/19  Yes [provider]  Travoprost, BAK Free, (TRAVATAN) 0.004 % SOLN ophthalmic solution INSTILL 1 DROP IN BOTH EYES EVERY EVENING 10/03/18  Yes [provider]   Ct Hip Left Wo Contrast  Result Date: 04/02/2019 CLINICAL DATA:  Recent fall with periprosthetic fracture on recent plain film EXAM: CT OF THE LEFT HIP WITHOUT CONTRAST TECHNIQUE: Multidetector CT imaging of the left hip was performed according to the standard protocol. Multiplanar CT image reconstructions were also generated. COMPARISON:  Plain film from the previous day FINDINGS: Bones/Joint/Cartilage Changes consistent with left hip replacement are noted. There is a periprosthetic fracture in the proximal  femur without significant displacement. Suggestion of undisplaced fractures of the left superior and inferior pubic rami are seen as well. Ligaments Suboptimally assessed by CT. Muscles and Tendons Surrounding musculature is within normal limits. Soft tissues Focal hematoma is noted adjacent to the greater trochanter laterally related to the recent injury. IMPRESSION: Undisplaced periprosthetic fracture similar to that seen on prior plain film examination. There is also suspicion for superior and inferior pubic rami fractures without significant displacement. Electronically Signed   By: Alcide Clever M.D.   On: 04/02/2019 01:43   Dg Chest Port 1 View  Result Date: 04/02/2019 CLINICAL DATA:  Preop EXAM: PORTABLE CHEST 1 VIEW COMPARISON:  None. FINDINGS: The heart size and mediastinal contours are within normal limits. Aortic knob calcifications are seen. Mildly increased interstitial markings seen at both lung bases as  on the prior exam. No acute osseous abnormality. IMPRESSION: No acute cardiopulmonary process. Electronically Signed   By: Prudencio Pair M.D.   On: 04/02/2019 00:48   Dg Hip Unilat With Pelvis 2-3 Views Left  Result Date: 04/01/2019 CLINICAL DATA:  Hip pain, fall EXAM: DG HIP (WITH OR WITHOUT PELVIS) 2-3V LEFT COMPARISON:  Radiograph 03/10/2017 FINDINGS: The osseous structures appear diffusely demineralized which may limit detection of small or nondisplaced fractures. There are postsurgical changes from prior left hip hemiarthroplasty in expected alignment when compared to prior radiographs. There is a questionable fracture lucency through the greater trochanter which may reflect a nondisplaced periprosthetic fracture with overlying soft tissue swelling and thickening. Bones of the pelvis are congruent. Arcuate lines are congruent. Right femoral head is normally located. Atherosclerotic calcifications are noted in the pelvis. IMPRESSION: 1. Left hip hemiarthroplasty with possible nondisplaced  periprosthetic fracture through the greater trochanter. 2. Adjacent soft tissue swelling and possible hematoma. 3. Diffuse bony demineralization which may limit detection of additional subtle nondisplaced fractures. Electronically Signed   By: Lovena Le M.D.   On: 04/01/2019 20:27    Positive ROS: All other systems have been reviewed and were otherwise negative with the exception of those mentioned in the HPI and as above.  Physical Exam: General:  Alert, but in mild distress Psychiatric:  Patient is competent for consent with normal mood and affect   Cardiovascular:  No pedal edema Respiratory:  No wheezing, non-labored breathing GI:  Abdomen is soft and non-tender Skin:  No lesions in the area of chief complaint Neurologic:  Sensation intact distally Lymphatic:  No axillary or cervical lymphadenopathy  Orthopedic Exam:  Orthopedic examination is limited to the left hip and lower extremity.  The left lower extremity appears to be out to length and normally rotated as compared to the right.  Skin inspection is notable for well-healed lateral incision, but otherwise is unremarkable.  No swelling, erythema, ecchymosis, or other skin abnormalities are identified.  She has mild-moderate tenderness to palpation over the greater trochanteric region laterally, and mild tenderness to palpation over the anterior pelvic region.  She has more severe pain with any attempted active or passive motion of the hip.  She is neurovascularly intact of the left lower extremity and foot, demonstrating the ability to actively actively dorsiflex and plantarflex her toes and ankle.  Sensations intact to light touch to all distributions.  She has good capillary refill to her left foot.  X-rays:  X-rays of the pelvis and left hip are available for review and have been reviewed by myself.  These films demonstrate an essentially nondisplaced isolated greater trochanteric fracture.  The femoral component appears to be in  good position and without evidence of loosening.  The femoral head is concentrically located within the acetabulum.  A CT scan of the pelvis also is available for review and has been reviewed by myself.  These films confirmed the nondisplaced greater trochanteric fracture noted in the plain radiographs.  This study also demonstrates nondisplaced left superior and inferior pubic rami fractures.  No other acute bony abnormalities or lytic lesions are identified.  Assessment: 1.  Nondisplaced left greater trochanteric fracture status post prior left hip hemiarthroplasty. 2.  Nondisplaced left superior and inferior pubic rami fractures.  Plan: The treatment options have been discussed with the patient and the daughter, who is at the patient's bedside.  The patient and her daughter are assured that these fractures do not require surgical intervention, so long as the  greater trochanteric fracture remains nondisplaced.  The patient may be mobilized with physical therapy, toe-touch weightbearing on the left initially, then being allowed to gradually progress into her weightbearing in several weeks as symptoms permit.  She may require skilled nursing placement during these first few weeks.  She also may receive appropriate pain medication as necessary.  Thank you for asking me to participate in the care of this most pleasant yet unfortunate woman.  I will be happy to follow her with you.   Maryagnes AmosJ. Jeffrey Crescencio Jozwiak, MD  Beeper #:  [phone]  04/02/2019 7:37 AM

## 2019-04-02 NOTE — Progress Notes (Signed)
  PROGRESS NOTE    Pamela Dickerson  XIH:[id number] DOB: 27-Jul-1926 DOA: 04/02/2019  PCP: Marzetta Board, DO    LOS - 0   85 y.o. female with history of anemia, PAD, thyroid disease, glaucoma admitted overnight with with left greater trochanteric fracture.  Ortho evaluated, injury is non-operative as long as it remains nondisplaced.  Recommend conservative management, pain control, PT and gradual increase in weightbearing as tolerated.    Patient seen and examined, daughter at bedside.  Patient is drowsy from pain medicine, but arouses.  Very hard of hearing.  Reported her pain adequately controlled.    H&P reviewed in detail and I agree with the assessment and plan as outlined.    Ezekiel Slocumb, DO [facility] Pager: [phone]  If 7PM-7AM, please contact night-coverage [email or web address] Password [facility] 04/02/2019, 9:24 PM

## 2019-04-02 NOTE — Progress Notes (Signed)
PT Cancellation Note  Patient Details Name: Pamela Dickerson MRN: [id number] DOB: [birth year]   Cancelled Treatment:    Reason Eval/Treat Not Completed: Medical issues which prohibited therapy.  Pt's recent Ka 5.4 and trending up falling outside guidelines for participation with PT services.  Will attempt to see pt at a future date/time as medially appropriate.    Linus Salmons PT, DPT 04/02/19, 9:20 AM

## 2019-04-02 NOTE — ED Notes (Signed)
IV attempt to right forearm unsuccessful

## 2019-04-03 DIAGNOSIS — N3281 Overactive bladder: Secondary | ICD-10-CM

## 2019-04-03 DIAGNOSIS — E039 Hypothyroidism, unspecified: Secondary | ICD-10-CM

## 2019-04-03 DIAGNOSIS — F419 Anxiety disorder, unspecified: Secondary | ICD-10-CM

## 2019-04-03 DIAGNOSIS — D649 Anemia, unspecified: Secondary | ICD-10-CM

## 2019-04-03 DIAGNOSIS — K219 Gastro-esophageal reflux disease without esophagitis: Secondary | ICD-10-CM

## 2019-04-03 DIAGNOSIS — H409 Unspecified glaucoma: Secondary | ICD-10-CM

## 2019-04-03 DIAGNOSIS — I10 Essential (primary) hypertension: Secondary | ICD-10-CM

## 2019-04-03 LAB — CBC WITH DIFFERENTIAL/PLATELET
Abs Immature Granulocytes: 0.02 10*3/uL (ref 0.00–0.07)
Basophils Absolute: 0 10*3/uL (ref 0.0–0.1)
Basophils Relative: 0 %
Eosinophils Absolute: 0.6 10*3/uL — ABNORMAL HIGH (ref 0.0–0.5)
Eosinophils Relative: 9 %
HCT: 26.4 % — ABNORMAL LOW (ref 36.0–46.0)
Hemoglobin: 7.9 g/dL — ABNORMAL LOW (ref 12.0–15.0)
Immature Granulocytes: 0 %
Lymphocytes Relative: 4 %
Lymphs Abs: 0.3 10*3/uL — ABNORMAL LOW (ref 0.7–4.0)
MCH: 23.7 pg — ABNORMAL LOW (ref 26.0–34.0)
MCHC: 29.9 g/dL — ABNORMAL LOW (ref 30.0–36.0)
MCV: 79.3 fL — ABNORMAL LOW (ref 80.0–100.0)
Monocytes Absolute: 0.7 10*3/uL (ref 0.1–1.0)
Monocytes Relative: 11 %
Neutro Abs: 4.6 10*3/uL (ref 1.7–7.7)
Neutrophils Relative %: 76 %
Platelets: 155 10*3/uL (ref 150–400)
RBC: 3.33 MIL/uL — ABNORMAL LOW (ref 3.87–5.11)
RDW: 20.8 % — ABNORMAL HIGH (ref 11.5–15.5)
WBC: 6.1 10*3/uL (ref 4.0–10.5)
nRBC: 0 % (ref 0.0–0.2)

## 2019-04-03 LAB — BASIC METABOLIC PANEL
Anion gap: 7 (ref 5–15)
BUN: 27 mg/dL — ABNORMAL HIGH (ref 8–23)
CO2: 26 mmol/L (ref 22–32)
Calcium: 8 mg/dL — ABNORMAL LOW (ref 8.9–10.3)
Chloride: 106 mmol/L (ref 98–111)
Creatinine, Ser: 0.68 mg/dL (ref 0.44–1.00)
GFR calc Af Amer: >60 mL/min (ref >60)
GFR calc non Af Amer: >60 mL/min (ref >60)
Glucose, Bld: 120 mg/dL — ABNORMAL HIGH (ref 70–99)
Potassium: 4.3 mmol/L (ref 3.5–5.1)
Sodium: 139 mmol/L (ref 135–145)

## 2019-04-03 NOTE — Progress Notes (Signed)
Upon assessment this am noted HA to left ear. Administered pain medication at that time pt moved HA to right ear. Daughter at bedside said she must of lost one, she has bilateral HAs. Transitioned to Low Bed per protocol, all linens were checked, nothing found.

## 2019-04-03 NOTE — Progress Notes (Signed)
Subjective: The patient denies any pain at rest, and has no acute new complaints at this time.  She has not yet started physical therapy due to an elevated potassium level yesterday, but is expected to begin therapy today.  Objective: Vital signs in last 24 hours: Temp:  [97.8 F (36.6 C)-98.5 F (36.9 C)] 98 F (36.7 C) (11/10 0802) Pulse Rate:  [89-109] 92 (11/10 0802) Resp:  [15-18] 18 (11/10 0802) BP: (109-120)/(47-60) 109/51 (11/10 0802) SpO2:  [99 %-100 %] 100 % (11/10 0802)  Intake/Output from previous day: 11/09 0701 - 11/10 0700 In: 2366.3 [P.O.:420; I.V.:1946.3] Out: 200 [Urine:200] Intake/Output this shift: Total I/O In: 240 [P.O.:240] Out: -   Recent Labs    04/02/19 0100 04/02/19 0512 04/03/19 0425  HGB 10.1* 9.5* 7.9*   Recent Labs    04/02/19 0512 04/03/19 0425  WBC 10.5 6.1  RBC 3.96 3.33*  HCT 32.2* 26.4*  PLT 230 155   Recent Labs    04/02/19 1533 04/03/19 0425  NA 140 139  K 4.4  4.3 4.3  CL 106 106  CO2 26 26  BUN 24* 27*  CREATININE 0.81 0.68  GLUCOSE 120* 120*  CALCIUM 8.4* 8.0*   No results for input(s): LABPT, INR in the last 72 hours.  Physical Exam: Orthopedic examination is unchanged as compared to the original consult note.  She again has tenderness to palpation over the lateral aspect of the hip as well as pain with attempted active or passive motion of the hip.  She is neurovascularly intact to the left lower extremity and foot.  Assessment: 1.  Nondisplaced left greater trochanteric fracture status post prior left hip hemiarthroplasty. 2.  Nondisplaced left superior and inferior pubic rami fractures.  Plan: The treatment options are reviewed with the patient.  She will be mobilized with physical therapy when she is stable from a medical standpoint.  She should start with toe-touch weightbearing on the left lower extremity to minimize stress on the greater tuberosity fracture.  After several weeks, she may progress in her  weightbearing as symptoms permit.  Thank you for allowing me to participate in the care of this most delightful woman.  I will sign off at this time but look forward to seeing her back in my office in 4 to 6 weeks for repeat x-rays.  If you need me between now and then, please reconsult me.  Marshall Cork Poggi 04/03/2019, 12:05 PM

## 2019-04-03 NOTE — Evaluation (Signed)
Physical Therapy Evaluation Patient Details Name: Pamela Dickerson MRN: [id number] DOB: 04-09-1927 Today's Date: 04/03/2019   History of Present Illness  From MD H&P: Pt is a 85 y.o. Caucasian female with a known history of anemia, thyroid disease, glaucoma and peripheral arterial disease, who presented to the emergency room with acute onset of mechanical fall with subsequent left hip pain.  The patient underwent left total hip arthroplasty in the past.  Left hip x-ray showed left hip hemiarthroplasty and possible nondisplaced periprostatic fracture through the greater trochanter. There is also question about superior and inferior pubic rami fractures without significant displacement.  Orthopedic MD assessment includes: Nondisplaced left greater trochanteric fracture and Nondisplaced left superior and inferior pubic rami fractures.    Clinical Impression  Pt presented with deficits in strength, transfers, mobility, gait, balance, and activity tolerance.  Pt was +2 total assist with bed mobility tasks and was unable to clear the surface of the mattress during transfer attempts even with +2 assist.  Pt initially presented with heavy posterior lean in sitting at the EOB requiring mod-max A to prevent LOB but balance improved with anterior weight shifting activities to where the pt only required occasional min A for sitting balance. Pt guarded with any touch or movement of the LLE including crying out when sock was gently donned to the L foot.  Pt reports no L hip pain at rest, however, even after mobility.  Pt will benefit from PT services in a SNF setting upon discharge to safely address above deficits for decreased caregiver assistance and eventual return to PLOF.       Follow Up Recommendations SNF    Equipment Recommendations  Other (comment)(TBD at next venue of care)    Recommendations for Other Services       Precautions / Restrictions Precautions Precautions: Fall Restrictions Weight  Bearing Restrictions: Yes LLE Weight Bearing: Touchdown weight bearing      Mobility  Bed Mobility Overal bed mobility: Needs Assistance Bed Mobility: Supine to Sit;Sit to Supine     Supine to sit: +2 for physical assistance;Total assist Sit to supine: +2 for physical assistance;Total assist   General bed mobility comments: Pt cries out in pain with any movement of the LLE and provided no assistance with bed mobility tasks; upon sitting pt required mod to max A to prevent posterior LOB that improved with anterior weight shifting activities  Transfers Overall transfer level: Needs assistance Equipment used: Rolling walker (2 wheeled) Transfers: Sit to/from Stand Sit to Stand: +2 physical assistance;Mod assist         General transfer comment: Pt able to minimally unweight bottom from bed surface but unable to come to standing; extensive cues provided to avoid WB through the LLE with L foot extended forward.  Ambulation/Gait             General Gait Details: Unable  Stairs            Wheelchair Mobility    Modified Rankin (Stroke Patients Only)       Balance Overall balance assessment: Needs assistance Sitting-balance support: Feet supported;Bilateral upper extremity supported Sitting balance-Leahy Scale: Poor Sitting balance - Comments: Mod to max A to prevent posterior LOB Postural control: Posterior lean                                   Pertinent Vitals/Pain Pain Assessment: 0-10 Pain Score: 0-No pain Pain Location: No pain  at rest but significant L hip pain with movement that pt unable to rate Pain Descriptors / Indicators: Grimacing;Guarding;Crying Pain Intervention(s): Monitored during session;Limited activity within patient's tolerance    Home Living Family/patient expects to be discharged to:: Private residence Living Arrangements: Children Available Help at Discharge: Family;Available PRN/intermittently Type of Home: Mobile  home Home Access: Stairs to enter Entrance Stairs-Rails: Right;Left;Can reach both Entrance Stairs-Number of Steps: 4 Home Layout: One level Home Equipment: Walker - 4 wheels;Walker - 2 wheels      Prior Function Level of Independence: Independent with assistive device(s)         Comments: Mod Ind amb limited community distances with a rollator, 2 falls in the last 6 months with pt unsure of cause, Ind with ADLs     Hand Dominance        Extremity/Trunk Assessment   Upper Extremity Assessment Upper Extremity Assessment: Generalized weakness    Lower Extremity Assessment Lower Extremity Assessment: Generalized weakness;LLE deficits/detail LLE: Unable to fully assess due to pain       Communication   Communication: HOH  Cognition Arousal/Alertness: Awake/alert Behavior During Therapy: WFL for tasks assessed/performed Overall Cognitive Status: Within Functional Limits for tasks assessed                                        General Comments      Exercises Total Joint Exercises Ankle Circles/Pumps: AROM;Both;10 reps Heel Slides: AROM;AAROM;Right;5 reps Hip ABduction/ADduction: AROM;AAROM;Right;5 reps Straight Leg Raises: AROM;AAROM;Right;5 reps Long Arc Quad: AROM;Both;5 reps Other Exercises Other Exercises: Anterior weight shifting activities in sitting at EOB to address posterior instability   Assessment/Plan    PT Assessment Patient needs continued PT services  PT Problem List Decreased strength;Decreased activity tolerance;Decreased balance;Decreased mobility;Decreased knowledge of use of DME;Decreased safety awareness;Decreased knowledge of precautions       PT Treatment Interventions DME instruction;Gait training;Stair training;Functional mobility training;Therapeutic activities;Therapeutic exercise;Balance training;Patient/family education    PT Goals (Current goals can be found in the Care Plan section)  Acute Rehab PT  Goals Patient Stated Goal: To get back home and to be able to walk again PT Goal Formulation: With patient Time For Goal Achievement: 04/16/19 Potential to Achieve Goals: Fair    Frequency 7X/week   Barriers to discharge Inaccessible home environment;Decreased caregiver support      Co-evaluation               AM-PAC PT "6 Clicks" Mobility  Outcome Measure Help needed turning from your back to your side while in a flat bed without using bedrails?: Total Help needed moving from lying on your back to sitting on the side of a flat bed without using bedrails?: Total Help needed moving to and from a bed to a chair (including a wheelchair)?: Total Help needed standing up from a chair using your arms (e.g., wheelchair or bedside chair)?: Total Help needed to walk in hospital room?: Total Help needed climbing 3-5 steps with a railing? : Total 6 Click Score: 6    End of Session Equipment Utilized During Treatment: Gait belt Activity Tolerance: Patient limited by pain Patient left: in bed;with call bell/phone within reach;with bed alarm set;with family/visitor present Nurse Communication: Mobility status PT Visit Diagnosis: History of falling (Z91.81);Difficulty in walking, not elsewhere classified (R26.2);Muscle weakness (generalized) (M62.81);Pain Pain - Right/Left: Left Pain - part of body: Hip    Time: [id number] PT Time  Calculation (min) (ACUTE ONLY): 33 min   Charges:   PT Evaluation $PT Eval Moderate Complexity: 1 Mod PT Treatments $Therapeutic Exercise: 8-22 mins        D. Scott Haydin Dunn PT, DPT 04/03/19, 11:09 AM

## 2019-04-03 NOTE — TOC Initial Note (Signed)
Transition of Care [facility]) - Initial/Assessment Note    Patient Details  Name: Pamela Dickerson MRN: [id number] Date of Birth: 03-Nov-1926  Transition of Care [facility]) CM/SW Contact:    Pamela Hutching, RN Phone Number: 04/03/2019, 11:39 AM  Clinical Narrative:                 Patient admitted to the hospital with hip fracture after a fall at home.  Patient lives with her daughter Pamela Dickerson.  Daughter reports that they have a walker, bedside commode, shower chair, and toilet riser at home.  Pamela Dickerson reports that she would like for her mother to be able to come back home but understands that she may need short term rehab.  Pamela Dickerson reports that patient has been to [facility] SN[facility] in the past when she broke her other hip, and that the [facility] in [place] is very close to her home, so those are her 2 top choices.   RNCM will begin bed search.   Expected Discharge Plan: Skilled Nursing Facility Barriers to Discharge: Continued Medical Work up   Patient Goals and CMS Choice Patient states their goals for this hospitalization and ongoing recovery are:: Patient's daughter would like to be able to take her back home at some point, understands that she may need short term rehab. CMS Medicare.gov Compare Post Acute Care list provided to:: Patient Represenative (must comment) Choice offered to / list presented to : Adult Children  Expected Discharge Plan and Services Expected Discharge Plan: [place]   Discharge Planning Services: CM Consult Post Acute Care Choice: [place] Living arrangements for the past 2 months: Single Family Home                                      Prior Living Arrangements/Services Living arrangements for the past 2 months: Single Family Home Lives with:: Adult Children Patient language and need for interpreter reviewed:: Yes Do you feel safe going back to the place where you live?: Yes      Need for Family Participation in  Patient Care: Yes (Comment)(broken hip) Care giver support system in place?: Yes (comment)(daughter)   Criminal Activity/Legal Involvement Pertinent to Current Situation/Hospitalization: No - Comment as needed  Activities of Daily Living      Permission Sought/Granted Permission sought to share information with : Case Manager, Family Supports, Chartered certified accountant granted to share information with : Yes, Verbal Permission Granted     Permission granted to share info w AGENCY: [facility] SN[facility], [facility] in [place] granted to share info w Relationship: daughter     Emotional Assessment Appearance:: Appears stated age     Orientation: : Oriented to Self Alcohol / Substance Use: Not Applicable Psych Involvement: No (comment)  Admission diagnosis:  Unable to bear weight [R26.89] Intractable pain [R52] Fall, initial encounter [W19.XXXA] Multiple closed fractures of pelvis without disruption of pelvic ring, initial encounter ([place]) [S32.82XA] Nondisplaced fracture of greater trochanter of left femur, initial encounter for closed fracture [facility]) [S72.115A] Patient Active Problem List   Diagnosis Date Noted  . Closed left hip fracture ([place]) 04/02/2019   PCP:  Pamela Board, DO Pharmacy:   [facility] [phone] [address], [place] AT [facility] OF [address] [place] [address] Phone: [phone] Fax: [phone]     Social  Determinants of Health (SDOH) Interventions    Readmission Risk Interventions No flowsheet data found.

## 2019-04-03 NOTE — Progress Notes (Addendum)
PROGRESS NOTE    Pamela Dickerson  ZOX:[id number] DOB: 06/04/1926 DOA: 04/02/2019  PCP: Patriciaann Clan, DO    LOS - 1   Brief Narrative:  85 y.o. Caucasian female with a history of anemia, thyroid disease, glaucoma and PAD, presented to the ED after mechanical fall with subsequent acute onset left hip pain.  Reported having unsteady gait recently, but no presyncope or syncope, dizziness, paresthesias or focal weakness.  Has history of total left arthroplasty in the past.  In the ED, vitals stable, labs notable only for mild leukocytosis and anemia.  Chest xray negative for acute findings.  Left hip xray showed left hip arthroplasty and possible nondisplaced periprostatic fracture of the great trochanter with adjacent soft tissue swelling and possible hematoma and diffuse bony demineralization.  CT obtained which did reveal greater trochanteric fracture and possible superior and inferior pubic rami fractures without significant displacement.  She was treated for pain and nausea, started on IV hydration.  Orthopedics was consulted and patient admitted for further evaluation and management.  Dr. Joice Lofts, orthopedist, evaluated and fractures did not require surgical intervention as long as they remained nondisplaced.  Recommended mobilization with PT as tolerated, toe-touch weight-bearing on left initially with gradual progression over several week as tolerated.  PT recommends SNF for rehab.   Subjective 11/10: Patient seen and examined, sitting up playing solitaire on cell phone.  No family at bedside during encounter.  She reports having some pain, but medication helps it but does not seem to last very long.  She denies other acute complaints including fever/chills, chest pain, shortness of breath, N/V/D.    Assessment & Plan:   Principal Problem:   Closed left hip fracture (HCC) Active Problems:   Essential hypertension   Chronic GERD   Hypothyroidism   Glaucoma   Overactive bladder    Anxiety   Left greater trochanteric hip fracture, secondary to mechanical fall History of left hip hemiarthroplasty -Ortho consulted, fracture nonoperative -Recommended mobilizing with physical therapy as tolerated, toe-touch weightbearing on left lower extremity initially with gradual progression of weightbearing over several weeks as tolerated -Ortho follow-up in 4 to 6 weeks for repeat x-rays -PT recommending SNF -Continue pain control -Bowel regimen  Acute on chronic anemia  baseline hemoglobin over the past couple of years appears to be 10-11.  Hemoglobin 10.1 on admission, currently down to 7.9.  Suspect this is dilutional with IV fluids, as all cell lines reduced since admission.  No evidence of blood loss. -Monitor CBC -Anemia panel with morning labs -Transfuse if hemoglobin less than 7  Hypertension -chronic, stable.  Continue home Toprol  Hypothyroidism - chronic, stable.  Continue home Synthroid  GERD -chronic.  Continue home PPI  Glaucoma -continue ophthalmic drops  Overactive bladder -continue home Ditropan  Anxiety -chronic, stable.  Continue home Ativan, but hold if sedated with pain meds   DVT prophylaxis: Lovenox     Code Status: Full Code  Family Communication: none at bedside  Disposition Plan:  To SNF once bed available, continue pain control and PT   Consultants:   Orthopedics  Procedures:   None  Antimicrobials:   None   Objective: Vitals:   04/02/19 1529 04/02/19 2331 04/02/19 2333 04/03/19 0802  BP: 120/60 (!) 119/47 (!) 119/47 (!) 109/51  Pulse: 100 89 89 92  Resp: Temp: 98.5 F (36.9 C)  97.8 F (36.6 C) 98 F (36.7 C)  TempSrc: Oral  Oral Oral  SpO2: 100%  100% 100%  Weight:      Height:        Intake/Output Summary (Last 24 hours) at 04/03/2019 1445 Last data filed at 04/03/2019 3086 Gross per 24 hour  Intake 2606.27 ml  Output 200 ml  Net 2406.27 ml   Filed Weights   04/01/19 1943  Weight: 59.9 kg     Examination:  General exam: awake, alert, no acute distress HEENT: moist mucus membranes, hearing impaired Respiratory system: clear to auscultation bilaterally, no wheezes, rales or rhonchi, normal respiratory effort. Cardiovascular system: normal S1/S2, RRR, no JVD, murmurs, rubs, gallops, no pedal edema.   Gastrointestinal system: soft, non-tender, non-distended abdomen, normal bowel sounds. Central nervous system: alert and oriented x3. no gross focal neurologic deficits, normal speech Extremities: moves all, no edema, no cyanosis Skin: dry, intact, normal temperature Psychiatry: normal mood, congruent affect, judgement and insight appear normal    Data Reviewed: I have personally reviewed following labs and imaging studies  CBC: Recent Labs  Lab 04/02/19 0100 04/02/19 0512 04/03/19 0425  WBC 11.6* 10.5 6.1  NEUTROABS 10.0*  --  4.6  HGB 10.1* 9.5* 7.9*  HCT 33.2* 32.2* 26.4*  MCV 77.6* 81.3 79.3*  PLT 241 230 578   Basic Metabolic Panel: Recent Labs  Lab 04/02/19 0100 04/02/19 0512 04/02/19 1533 04/03/19 0425  NA 137 139 140 139  K 4.3 5.4* 4.4   4.3 4.3  CL 103 104 106 106  CO2 23 28 26 26   GLUCOSE 148* 138* 120* 120*  BUN 25* 24* 24* 27*  CREATININE 0.74 0.76 0.81 0.68  CALCIUM 8.7* 8.5* 8.4* 8.0*   GFR: Estimated Creatinine Clearance: 37.9 mL/min (by C-G formula based on SCr of 0.68 mg/dL). Liver Function Tests: Recent Labs  Lab 04/02/19 0100  AST 22  ALT 15  ALKPHOS 115  BILITOT 0.5  PROT 6.8  ALBUMIN 3.8   No results for input(s): LIPASE, AMYLASE in the last 168 hours. No results for input(s): AMMONIA in the last 168 hours. Coagulation Profile: No results for input(s): INR, PROTIME in the last 168 hours. Cardiac Enzymes: No results for input(s): CKTOTAL, CKMB, CKMBINDEX, TROPONINI in the last 168 hours. BNP (last 3 results) No results for input(s): PROBNP in the last 8760 hours. HbA1C: No results for input(s): HGBA1C in the last 72  hours. CBG: Recent Labs  Lab 04/02/19 1549  GLUCAP 115*   Lipid Profile: No results for input(s): CHOL, HDL, LDLCALC, TRIG, CHOLHDL, LDLDIRECT in the last 72 hours. Thyroid Function Tests: No results for input(s): TSH, T4TOTAL, FREET4, T3FREE, THYROIDAB in the last 72 hours. Anemia Panel: No results for input(s): VITAMINB12, FOLATE, FERRITIN, TIBC, IRON, RETICCTPCT in the last 72 hours. Sepsis Labs: No results for input(s): PROCALCITON, LATICACIDVEN in the last 168 hours.  Recent Results (from the past 240 hour(s))  SARS CORONAVIRUS 2 (TAT 6-24 HRS) Nasopharyngeal Nasopharyngeal Swab     Status: None   Collection Time: 04/02/19  3:58 AM   Specimen: Nasopharyngeal Swab  Result Value Ref Range Status   SARS Coronavirus 2 NEGATIVE NEGATIVE Final    Comment: (NOTE) SARS-CoV-2 target nucleic acids are NOT DETECTED. The SARS-CoV-2 RNA is generally detectable in upper and lower respiratory specimens during the acute phase of infection. Negative results do not preclude SARS-CoV-2 infection, do not rule out co-infections with other pathogens, and should not be used as the sole basis for treatment or other patient management decisions. Negative results must be combined with clinical observations, patient history, and epidemiological information. The expected result  is Negative. Fact Sheet for Patients: HairSlick.nohttps://www.fda.gov/media/138098/download Fact Sheet for Healthcare Providers: quierodirigir.comhttps://www.fda.gov/media/138095/download This test is not yet approved or cleared by the [place] FDA and  has been authorized for detection and/or diagnosis of SARS-CoV-2 by FDA under an Emergency Use Authorization (EUA). This EUA will remain  in effect (meaning this test can be used) for the duration of the COVID-19 declaration under Section 56 4(b)(1) of the Act, 21 U.S.C. section 360bbb-3(b)(1), unless the authorization is terminated or revoked sooner. Performed at [facility], [address] [place], [place] [address]          Radiology Studies: Ct Head Wo Contrast  Result Date: 04/02/2019 CLINICAL DATA:  Unresponsive beginning about 1500 hours. Fell yesterday. EXAM: CT HEAD WITHOUT CONTRAST TECHNIQUE: Contiguous axial images were obtained from the base of the skull through the vertex without intravenous contrast. COMPARISON:  03/08/2016 FINDINGS: Brain: Generalized atrophy. No sign of recent infarction, intra-axial mass lesion, hemorrhage, hydrocephalus or extra-axial collection. Ordinary mild chronic small-vessel change of the white matter, typical for age. Insignificant calcified 2 cm meningioma projecting towards the left from the superior falx. No mass-effect upon the brain. Vascular: There is atherosclerotic calcification of the major vessels at the base of the brain. Skull: Negative Sinuses/Orbits: Clear/normal Other: None IMPRESSION: No acute finding by CT. Generalized atrophy. Ordinary chronic small-vessel change of the white matter, typical for age. Electronically Signed   By: Paulina FusiMark  Shogry M.D.   On: 04/02/2019 17:36   Ct Hip Left Wo Contrast  Result Date: 04/02/2019 CLINICAL DATA:  Recent fall with periprosthetic fracture on recent plain film EXAM: CT OF THE LEFT HIP WITHOUT CONTRAST TECHNIQUE: Multidetector CT imaging of the left hip was performed according to the standard protocol. Multiplanar CT image reconstructions were also generated. COMPARISON:  Plain film from the previous day FINDINGS: Bones/Joint/Cartilage Changes consistent with left hip replacement are noted. There is a periprosthetic fracture in the proximal femur without significant displacement. Suggestion of undisplaced fractures of the left superior and inferior pubic rami are seen as well. Ligaments Suboptimally assessed by CT. Muscles and Tendons Surrounding musculature is within normal limits. Soft tissues Focal hematoma is noted adjacent to the greater trochanter laterally related to the recent injury.  IMPRESSION: Undisplaced periprosthetic fracture similar to that seen on prior plain film examination. There is also suspicion for superior and inferior pubic rami fractures without significant displacement. Electronically Signed   By: Alcide CleverMark  Lukens M.D.   On: 04/02/2019 01:43   Dg Chest Port 1 View  Result Date: 04/02/2019 CLINICAL DATA:  Preop EXAM: PORTABLE CHEST 1 VIEW COMPARISON:  None. FINDINGS: The heart size and mediastinal contours are within normal limits. Aortic knob calcifications are seen. Mildly increased interstitial markings seen at both lung bases as on the prior exam. No acute osseous abnormality. IMPRESSION: No acute cardiopulmonary process. Electronically Signed   By: Jonna ClarkBindu  Avutu M.D.   On: 04/02/2019 00:48   Dg Hip Unilat With Pelvis 2-3 Views Left  Result Date: 04/01/2019 CLINICAL DATA:  Hip pain, fall EXAM: DG HIP (WITH OR WITHOUT PELVIS) 2-3V LEFT COMPARISON:  Radiograph 03/10/2017 FINDINGS: The osseous structures appear diffusely demineralized which may limit detection of small or nondisplaced fractures. There are postsurgical changes from prior left hip hemiarthroplasty in expected alignment when compared to prior radiographs. There is a questionable fracture lucency through the greater trochanter which may reflect a nondisplaced periprosthetic fracture with overlying soft tissue swelling and thickening. Bones of the pelvis are congruent. Arcuate lines  are congruent. Right femoral head is normally located. Atherosclerotic calcifications are noted in the pelvis. IMPRESSION: 1. Left hip hemiarthroplasty with possible nondisplaced periprosthetic fracture through the greater trochanter. 2. Adjacent soft tissue swelling and possible hematoma. 3. Diffuse bony demineralization which may limit detection of additional subtle nondisplaced fractures. Electronically Signed   By: Kreg Shropshire M.D.   On: 04/01/2019 20:27        Scheduled Meds:  acetaminophen  1,000 mg Oral Q8H    brimonidine  1 drop Both Eyes TID   cholecalciferol  2,000 Units Oral Daily   enoxaparin (LOVENOX) injection  40 mg Subcutaneous Q24H   ferrous sulfate  325 mg Oral Q breakfast   latanoprost  1 drop Both Eyes QHS   levothyroxine  100 mcg Oral QAC breakfast   metoprolol tartrate  25 mg Oral BID   multivitamin with minerals  1 tablet Oral Daily   nystatin  5 mL Oral QID   oxybutynin  5 mg Oral QHS   pantoprazole  40 mg Oral Daily   PARoxetine  40 mg Oral BH-q7a   pramipexole  0.125 mg Oral TID   pravastatin  40 mg Oral Daily   Continuous Infusions:  sodium chloride 75 mL/hr at 04/03/19 0600     LOS: 1 day    Time spent: 25-30 min    Pennie Banter, DO [facility] Pager: [phone]  If 7PM-7AM, please contact night-coverage [email or web address] Password [email or web address] 04/03/2019, 2:45 PM

## 2019-04-04 DIAGNOSIS — D649 Anemia, unspecified: Secondary | ICD-10-CM

## 2019-04-04 DIAGNOSIS — Z7189 Other specified counseling: Secondary | ICD-10-CM

## 2019-04-04 LAB — IRON AND TIBC
Iron: 16 ug/dL — ABNORMAL LOW (ref 28–170)
Saturation Ratios: 5 % — ABNORMAL LOW (ref 10.4–31.8)
TIBC: 305 ug/dL (ref 250–450)
UIBC: 289 ug/dL

## 2019-04-04 LAB — CBC WITH DIFFERENTIAL/PLATELET
Abs Immature Granulocytes: 0.03 10*3/uL (ref 0.00–0.07)
Basophils Absolute: 0 10*3/uL (ref 0.0–0.1)
Basophils Relative: 1 %
Eosinophils Absolute: 0.4 10*3/uL (ref 0.0–0.5)
Eosinophils Relative: 5 %
HCT: 28.3 % — ABNORMAL LOW (ref 36.0–46.0)
Hemoglobin: 8.2 g/dL — ABNORMAL LOW (ref 12.0–15.0)
Immature Granulocytes: 0 %
Lymphocytes Relative: 5 %
Lymphs Abs: 0.3 10*3/uL — ABNORMAL LOW (ref 0.7–4.0)
MCH: 23.8 pg — ABNORMAL LOW (ref 26.0–34.0)
MCHC: 29 g/dL — ABNORMAL LOW (ref 30.0–36.0)
MCV: 82 fL (ref 80.0–100.0)
Monocytes Absolute: 0.7 10*3/uL (ref 0.1–1.0)
Monocytes Relative: 9 %
Neutro Abs: 5.8 10*3/uL (ref 1.7–7.7)
Neutrophils Relative %: 80 %
Platelets: 173 10*3/uL (ref 150–400)
RBC: 3.45 MIL/uL — ABNORMAL LOW (ref 3.87–5.11)
RDW: 21.5 % — ABNORMAL HIGH (ref 11.5–15.5)
Smear Review: NORMAL
WBC: 7.2 10*3/uL (ref 4.0–10.5)
nRBC: 0 % (ref 0.0–0.2)

## 2019-04-04 LAB — BASIC METABOLIC PANEL
Anion gap: 9 (ref 5–15)
BUN: 32 mg/dL — ABNORMAL HIGH (ref 8–23)
CO2: 24 mmol/L (ref 22–32)
Calcium: 8.5 mg/dL — ABNORMAL LOW (ref 8.9–10.3)
Chloride: 105 mmol/L (ref 98–111)
Creatinine, Ser: 0.76 mg/dL (ref 0.44–1.00)
GFR calc Af Amer: >60 mL/min (ref >60)
GFR calc non Af Amer: >60 mL/min (ref >60)
Glucose, Bld: 136 mg/dL — ABNORMAL HIGH (ref 70–99)
Potassium: 4.2 mmol/L (ref 3.5–5.1)
Sodium: 138 mmol/L (ref 135–145)

## 2019-04-04 LAB — RETICULOCYTES
Immature Retic Fract: 29.6 % — ABNORMAL HIGH (ref 2.3–15.9)
RBC.: 3.45 MIL/uL — ABNORMAL LOW (ref 3.87–5.11)
Retic Count, Absolute: 87.3 10*3/uL (ref 19.0–186.0)
Retic Ct Pct: 2.5 % (ref 0.4–3.1)

## 2019-04-04 LAB — FERRITIN: Ferritin: 68 ng/mL (ref 11–307)

## 2019-04-04 LAB — FOLATE: Folate: 16.8 ng/mL (ref >5.9)

## 2019-04-04 LAB — VITAMIN B12: Vitamin B-12: 967 pg/mL — ABNORMAL HIGH (ref 180–914)

## 2019-04-04 NOTE — Progress Notes (Signed)
Patient suffers from pain associated with trochanter fracture caused by a fall.  Hospital bed will alleviate pain by allowing hip to be positioned in ways not feasible with a normal bed.  Pain episodes are with every movement and require frequent changes in body position which cannot be achieved with a normal bed.

## 2019-04-04 NOTE — Progress Notes (Signed)
Physical Therapy Treatment Patient Details Name: Pamela Dickerson MRN: [id number] DOB: Apr 21, 1927 Today's Date: 04/04/2019    History of Present Illness From MD H&P: Pt is a 85 y.o. Caucasian female with a known history of anemia, thyroid disease, glaucoma and peripheral arterial disease, who presented to the emergency room with acute onset of mechanical fall with subsequent left hip pain.  The patient underwent left total hip arthroplasty in the past.  Left hip x-ray showed left hip hemiarthroplasty and possible nondisplaced periprostatic fracture through the greater trochanter. There is also question about superior and inferior pubic rami fractures without significant displacement.  Orthopedic MD assessment includes: Nondisplaced left greater trochanteric fracture and Nondisplaced left superior and inferior pubic rami fractures.    PT Comments    Pt presented with deficits in strength, transfers, mobility, gait, balance, and activity tolerance.  Pt found in room with nasal canula removed and SpO2 74%, nursing notified and 2LO2/min donned.  Pt educated on PLB with SpO2 reaching a max of 83%, nursing called and directed writer to increase O2 to 3LO2/min.  Pt again educated on PLB this time while in sitting at the EOB with SpO2 increasing to 89-90%, nursing aware and requested pt to be left on 3LO2/min.  Multiple transfer attempts made with +2 max A during from an elevated EOB with pt's L foot on top of this PTs foot to ensure WB compliance with pt unable to clear surface of bed.  Pt remains very limited functionally and would not be safe to return to her prior living situation at this time.  Pt will benefit from PT services in a SNF setting upon discharge to safely address above deficits for decreased caregiver assistance and eventual return to PLOF.     Follow Up Recommendations  SNF     Equipment Recommendations  Other (comment)(TBD at next venue of care)    Recommendations for Other Services        Precautions / Restrictions Precautions Precautions: Fall Restrictions Weight Bearing Restrictions: Yes LLE Weight Bearing: Touchdown weight bearing    Mobility  Bed Mobility Overal bed mobility: Needs Assistance Bed Mobility: Supine to Sit;Sit to Supine     Supine to sit: +2 for physical assistance;Total assist Sit to supine: +2 for physical assistance;Total assist   General bed mobility comments: Pt cries out in pain with any movement of the LLE and provided no assistance with bed mobility tasks  Transfers Overall transfer level: Needs assistance Equipment used: Rolling walker (2 wheeled) Transfers: Sit to/from Stand Sit to Stand: +2 physical assistance;Max assist         General transfer comment: +2 max A during multiple transfer attempts at an elevated EOB with pt's L foot on top of this PTs foot to ensure WB compliance with pt unable to clear surface of bed  Ambulation/Gait             General Gait Details: Unable   Stairs             Wheelchair Mobility    Modified Rankin (Stroke Patients Only)       Balance Overall balance assessment: Needs assistance Sitting-balance support: Feet supported;Bilateral upper extremity supported Sitting balance-Leahy Scale: Fair Sitting balance - Comments: Improved static sitting balance this session with the pt able to maintain balance without physical assistance  Cognition Arousal/Alertness: Awake/alert Behavior During Therapy: WFL for tasks assessed/performed Overall Cognitive Status: Within Functional Limits for tasks assessed                                        Exercises Total Joint Exercises Hip ABduction/ADduction: AROM;AAROM;5 reps;Both Straight Leg Raises: AROM;AAROM;5 reps;Both Long Arc Quad: AROM;Both;5 reps Other Exercises Other Exercises: Static sitting at EOB x 8 min Other Exercises: HEP education with pt and daughter  for BLE APs, QS, and GS x 10 each every 1-2 hours daily    General Comments        Pertinent Vitals/Pain Pain Assessment: No/denies pain Pain Location: No pain at rest but significant L hip pain with movement that pt unable to rate Pain Descriptors / Indicators: Grimacing;Guarding;Crying Pain Intervention(s): Limited activity within patient's tolerance;Premedicated before session;Monitored during session    Home Living                      Prior Function            PT Goals (current goals can now be found in the care plan section) Progress towards PT goals: Progressing toward goals    Frequency    7X/week      PT Plan Current plan remains appropriate    Co-evaluation              AM-PAC PT "6 Clicks" Mobility   Outcome Measure  Help needed turning from your back to your side while in a flat bed without using bedrails?: Total Help needed moving from lying on your back to sitting on the side of a flat bed without using bedrails?: Total Help needed moving to and from a bed to a chair (including a wheelchair)?: Total Help needed standing up from a chair using your arms (e.g., wheelchair or bedside chair)?: Total Help needed to walk in hospital room?: Total Help needed climbing 3-5 steps with a railing? : Total 6 Click Score: 6    End of Session Equipment Utilized During Treatment: Gait belt;Oxygen Activity Tolerance: Patient limited by pain Patient left: in bed;with call bell/phone within reach;with bed alarm set;with family/visitor present Nurse Communication: Mobility status PT Visit Diagnosis: History of falling (Z91.81);Difficulty in walking, not elsewhere classified (R26.2);Muscle weakness (generalized) (M62.81);Pain Pain - Right/Left: Left Pain - part of body: Hip     Time: 1610-9604 PT Time Calculation (min) (ACUTE ONLY): 27 min  Charges:  $Therapeutic Activity: 23-37 mins                     D. Scott Deshun Sedivy PT, DPT 04/04/19, 3:39  PM

## 2019-04-04 NOTE — Progress Notes (Signed)
PROGRESS NOTE  Pamela Dickerson TSV:[id number] DOB: [birth year] DOA: 04/02/2019 PCP: Patriciaann Clan, DO   LOS: 2 days   Brief Narrative / Interim history: 85 year old female with history of mild dementia, anemia, hypothyroidism, glaucoma, PAD who came into the ED and was admitted on 04/02/2019 after having a mechanical fall with subsequent acute left hip pain.  At baseline she is relatively functional, lives with her daughter and is able to ambulate using a walker.  In the ED, left hip x-ray showed left hip arthroplasty and possible nondisplaced periprosthetic fracture of the greater trochanter.  This was followed by CT of the hip which showed greater trochanteric fracture with possible superior inferior pubic rami fractures without significant displacement.  Orthopedic surgery was consulted and recommended conservative management and she does not require surgical intervention at this point as long as they remain nondisplaced.  Subjective / 24h Interval events: Complains of left foot pain, that has been chronic, on and off for several months.  Denies any chest pain, no shortness of breath.  No abdominal pain, no nausea or vomiting.  Her daughter is at bedside.  Assessment & Plan: Principal Problem:   Closed left hip fracture (HCC) Active Problems:   Essential hypertension   Chronic GERD   Hypothyroidism   Glaucoma   Overactive bladder   Anxiety   Anemia   Principal Problem Left greater trochanteric hip fracture, secondary to mechanical fall History of left hip hemiarthroplasty -Orthopedic surgery consulted and evaluated patient.  For now recommending nonoperative management, mobilizing with physical therapy as tolerated, toe-touch weightbearing on left with gradual progression of weightbearing over several weeks -daughter is mentioning that toradol works well, continue -IS, mobilize as able, sit upright most part of the day    Active Problems Acute on chronic anemia -Baseline hemoglobin in  the past couple years appears to be 10-11.  Currently in the 8 range, anemia panel this morning shows iron deficiency -Start iron supplementation -No evidence of bleeding, continue to monitor daily CBCs  Hypertension -Continue home metoprolol, blood pressure stable  Hypothyroidism -continue levothyroxine  GERD  -chronic.  Continue home PPI  Glaucoma  -continue ophthalmic drops  Overactive bladder  -continue home Ditropan  Anxiety  -chronic, stable.  Continue home Ativan, but hold if sedated with pain meds  Goals of care -Discussed with daughter who is at bedside, she was listed as full code, after discussing CPR/events in the case of a cold, she elects for her mom to be partial code, she does not wish chest compressions but does wish for limited full ventilatory support in case of respiratory event.  Will change to no CPR/okay for BiPAP/vent  Scheduled Meds: . acetaminophen  1,000 mg Oral Q8H  . brimonidine  1 drop Both Eyes TID  . cholecalciferol  2,000 Units Oral Daily  . enoxaparin (LOVENOX) injection  40 mg Subcutaneous Q24H  . ferrous sulfate  325 mg Oral Q breakfast  . latanoprost  1 drop Both Eyes QHS  . levothyroxine  100 mcg Oral QAC breakfast  . metoprolol tartrate  25 mg Oral BID  . multivitamin with minerals  1 tablet Oral Daily  . nystatin  5 mL Oral QID  . oxybutynin  5 mg Oral QHS  . pantoprazole  40 mg Oral Daily  . PARoxetine  40 mg Oral BH-q7a  . pramipexole  0.125 mg Oral TID  . pravastatin  40 mg Oral Daily   Continuous Infusions: PRN Meds:.albuterol, bisacodyl, cyclobenzaprine, ketorolac, LORazepam, magnesium hydroxide, morphine injection, ondansetron **  OR** ondansetron (ZOFRAN) IV, traMADol, traZODone  DVT prophylaxis: Lovenox Code Status: Partial code Family Communication: Daughter present at bedside Disposition Plan: PT to evaluate, likely will need SNF.  Daughter agreeable.  Consultants:  Orthopedic surgery  Procedures:  None    Microbiology  SARS-CoV-2 04/02/2019-negative  Antimicrobials: None   Objective: Vitals:   04/02/19 2333 04/03/19 0802 04/03/19 1552 04/03/19 2107  BP: (!) 119/47 (!) 109/51 (!) 108/54 (!) 127/57  Pulse: 89 92 (!) 107   Resp: 18 18 17    Temp: 97.8 F (36.6 C) 98 F (36.7 C) 97.9 F (36.6 C) 98.5 F (36.9 C)  TempSrc: Oral Oral Oral Oral  SpO2: 100% 100% 92%   Weight:      Height:        Intake/Output Summary (Last 24 hours) at 04/04/2019 1042 Last data filed at 04/04/2019 0900 Gross per 24 hour  Intake 870.76 ml  Output -  Net 870.76 ml   Filed Weights   04/01/19 1943  Weight: 59.9 kg    Examination:  Constitutional: NAD Eyes: lids and conjunctivae normal, no scleral icterus ENMT: Mucous membranes are moist.  Neck: normal, supple Respiratory: clear to auscultation bilaterally, no wheezing, no crackles. Normal respiratory effort.  Cardiovascular: Regular rate and rhythm, no murmurs / rubs / gallops. No LE edema.  Abdomen: no tenderness. Bowel sounds positive.  Musculoskeletal: no clubbing / cyanosis.  Skin: no rashes Neurologic: Strength 5/5 in all 4.  Psychiatric: Normal judgment and insight. Alert and oriented x 3.    Data Reviewed: I have independently reviewed following labs and imaging studies   CBC: Recent Labs  Lab 04/02/19 0100 04/02/19 0512 04/03/19 0425 04/04/19 0406  WBC 11.6* 10.5 6.1 7.2  NEUTROABS 10.0*  --  4.6 5.8  HGB 10.1* 9.5* 7.9* 8.2*  HCT 33.2* 32.2* 26.4* 28.3*  MCV 77.6* 81.3 79.3* 82.0  PLT 241 230 155 173   Basic Metabolic Panel: Recent Labs  Lab 04/02/19 0100 04/02/19 0512 04/02/19 1533 04/03/19 0425 04/04/19 0406  NA 137 139 140 139 138  K 4.3 5.4* 4.4  4.3 4.3 4.2  CL 103 104 106 106 105  CO2 23 28 26 26 24   GLUCOSE 148* 138* 120* 120* 136*  BUN 25* 24* 24* 27* 32*  CREATININE 0.74 0.76 0.81 0.68 0.76  CALCIUM 8.7* 8.5* 8.4* 8.0* 8.5*   GFR: Estimated Creatinine Clearance: 37.9 mL/min (by C-G formula  based on SCr of 0.76 mg/dL). Liver Function Tests: Recent Labs  Lab 04/02/19 0100  AST 22  ALT 15  ALKPHOS 115  BILITOT 0.5  PROT 6.8  ALBUMIN 3.8   Coagulation Profile: No results for input(s): INR, PROTIME in the last 168 hours. HbA1C: No results for input(s): HGBA1C in the last 72 hours. CBG: Recent Labs  Lab 04/02/19 1549  GLUCAP 115*    Recent Results (from the past 240 hour(s))  SARS CORONAVIRUS 2 (TAT 6-24 HRS) Nasopharyngeal Nasopharyngeal Swab     Status: None   Collection Time: 04/02/19  3:58 AM   Specimen: Nasopharyngeal Swab  Result Value Ref Range Status   SARS Coronavirus 2 NEGATIVE NEGATIVE Final    Comment: (NOTE) SARS-CoV-2 target nucleic acids are NOT DETECTED. The SARS-CoV-2 RNA is generally detectable in upper and lower respiratory specimens during the acute phase of infection. Negative results do not preclude SARS-CoV-2 infection, do not rule out co-infections with other pathogens, and should not be used as the sole basis for treatment or other patient management decisions. Negative results  must be combined with clinical observations, patient history, and epidemiological information. The expected result is Negative. Fact Sheet for Patients: SugarRoll.be Fact Sheet for Healthcare Providers: https://www.woods-mathews.com/ This test is not yet approved or cleared by the [place] FDA and  has been authorized for detection and/or diagnosis of SARS-CoV-2 by FDA under an Emergency Use Authorization (EUA). This EUA will remain  in effect (meaning this test can be used) for the duration of the COVID-19 declaration under Section 56 4(b)(1) of the Act, 21 U.S.C. section 360bbb-3(b)(1), unless the authorization is terminated or revoked sooner. Performed at [facility], [place] [address] [place], [place] [address]      Radiology Studies: Ct Head Wo Contrast  Result Date: 04/02/2019 CLINICAL DATA:   Unresponsive beginning about 1500 hours. Fell yesterday. EXAM: CT HEAD WITHOUT CONTRAST TECHNIQUE: Contiguous axial images were obtained from the base of the skull through the vertex without intravenous contrast. COMPARISON:  03/08/2016 FINDINGS: Brain: Generalized atrophy. No sign of recent infarction, intra-axial mass lesion, hemorrhage, hydrocephalus or extra-axial collection. Ordinary mild chronic small-vessel change of the white matter, typical for age. Insignificant calcified 2 cm meningioma projecting towards the left from the superior falx. No mass-effect upon the brain. Vascular: There is atherosclerotic calcification of the major vessels at the base of the brain. Skull: Negative Sinuses/Orbits: Clear/normal Other: None IMPRESSION: No acute finding by CT. Generalized atrophy. Ordinary chronic small-vessel change of the white matter, typical for age. Electronically Signed   By: Nelson Chimes M.D.   On: 04/02/2019 17:36     Time spent: 35 minutes, more than 50% at bedside, d/w goals of care/addressing CODE STATUS   Marzetta Board, MD, PhD [facility]  Contact via  [email or web address]

## 2019-04-04 NOTE — TOC Progression Note (Signed)
Transition of Care [facility]) - Progression Note    Patient Details  Name: Pamela Dickerson MRN: [id number] Date of Birth: Dec 02, 1926  Transition of Care [facility]) CM/SW Contact  Shelbie Hutching, RN Phone Number: 04/04/2019, 3:02 PM  Clinical Narrative:    Daughter made aware that patient cannot have visitors while in rehab, hearing this news the daughter now wants to take the patient home.  Daughter reports she understands the implications of taking the patient home, daughter understands that she will not have 24 hour care provided by home health that it is the family's responsibility to provide all care.  RNCM will order hospital bed and hoyer lift through [employer] and have it delivered to the home tomorrow before discharge.  RNCM has arranged home health services with [facility], referral given to Sharmon Revere for RN, PT, OT, aide and SW.  Malachy Mood reports that they can see the patient on Friday.  Patient will discharge home via EMS to daughter's house.  Plan to discharge tomorrow.  Plan to wean oxygen off tonight.    Expected Discharge Plan: [place] Barriers to Discharge: Continued Medical Work up  Expected Discharge Plan and Services Expected Discharge Plan: [place]   Discharge Planning Services: CM Consult Post Acute Care Choice: [place] arrangements for the past 2 months: Single Family Home                 DME Arranged: Other see comment, Hospital bed(hoyer lift) DME Agency: [employer] Date DME Agency Contacted: 04/04/19 Time DME Agency Contacted: 1501 Representative spoke with at DME Agency: [place]: RN, PT, OT, Nurse's Aide, Social Work [employer] Agency: [place] Date [place]: 04/04/19 Time [place]: 1501 Representative spoke with at [place]: [place] ([place]) Interventions    Readmission Risk Interventions No flowsheet data found.

## 2019-04-05 ENCOUNTER — Inpatient Hospital Stay: Payer: Medicare Other

## 2019-04-05 LAB — CBC
HCT: 29 % — ABNORMAL LOW (ref 36.0–46.0)
Hemoglobin: 8.9 g/dL — ABNORMAL LOW (ref 12.0–15.0)
MCH: 24.3 pg — ABNORMAL LOW (ref 26.0–34.0)
MCHC: 30.7 g/dL (ref 30.0–36.0)
MCV: 79 fL — ABNORMAL LOW (ref 80.0–100.0)
Platelets: 179 10*3/uL (ref 150–400)
RBC: 3.67 MIL/uL — ABNORMAL LOW (ref 3.87–5.11)
RDW: 21.8 % — ABNORMAL HIGH (ref 11.5–15.5)
WBC: 8.5 10*3/uL (ref 4.0–10.5)
nRBC: 0 % (ref 0.0–0.2)

## 2019-04-05 LAB — BASIC METABOLIC PANEL
Anion gap: 8 (ref 5–15)
BUN: 38 mg/dL — ABNORMAL HIGH (ref 8–23)
CO2: 25 mmol/L (ref 22–32)
Calcium: 8.7 mg/dL — ABNORMAL LOW (ref 8.9–10.3)
Chloride: 105 mmol/L (ref 98–111)
Creatinine, Ser: 0.92 mg/dL (ref 0.44–1.00)
GFR calc Af Amer: >60 mL/min (ref >60)
GFR calc non Af Amer: 55 mL/min — ABNORMAL LOW (ref >60)
Glucose, Bld: 118 mg/dL — ABNORMAL HIGH (ref 70–99)
Potassium: 4 mmol/L (ref 3.5–5.1)
Sodium: 138 mmol/L (ref 135–145)

## 2019-04-05 MED ORDER — LORAZEPAM 1 MG PO TABS: ORAL_TABLET | ORAL | 0 refills | Status: AC

## 2019-04-05 MED ORDER — KETOROLAC TROMETHAMINE 10 MG PO TABS: 10.0000 mg | ORAL_TABLET | Freq: Four times a day (QID) | ORAL | 0 refills | Status: AC | PRN

## 2019-04-05 MED ORDER — TRAMADOL HCL 50 MG PO TABS: 50.0000 mg | ORAL_TABLET | Freq: Four times a day (QID) | ORAL | 0 refills | Status: AC | PRN

## 2019-04-05 NOTE — Discharge Instructions (Signed)
Follow with Marzetta Board, DO in 2-3 weeks  Please get a complete blood count and chemistry panel checked by your Primary MD at your next visit, and again as instructed by your Primary MD. Please get your medications reviewed and adjusted by your Primary MD.  Please request your Primary MD to go over all Hospital Tests and Procedure/Radiological results at the follow up, please get all Hospital records sent to your Prim MD by signing hospital release before you go home.  In some cases, there will be blood work, cultures and biopsy results pending at the time of your discharge. Please request that your primary care M.D. goes through all the records of your hospital data and follows up on these results.  If you had Pneumonia of Lung problems at the Hospital: Please get a 2 view Chest X ray done in 6-8 weeks after hospital discharge or sooner if instructed by your Primary MD.  If you have Congestive Heart Failure: Please call your Cardiologist or Primary MD anytime you have any of the following symptoms:  1) 3 pound weight gain in 24 hours or 5 pounds in 1 week  2) shortness of breath, with or without a dry hacking cough  3) swelling in the hands, feet or stomach  4) if you have to sleep on extra pillows at night in order to breathe  Follow cardiac low salt diet and 1.5 lit/day fluid restriction.  If you have diabetes Accuchecks 4 times/day, Once in AM empty stomach and then before each meal. Log in all results and show them to your primary doctor at your next visit. If any glucose reading is under 80 or above 300 call your primary MD immediately.  If you have Seizure/Convulsions/Epilepsy: Please do not drive, operate heavy machinery, participate in activities at heights or participate in high speed sports until you have seen by Primary MD or a Neurologist and advised to do so again. Per [facility] statutes, patients with seizures are not allowed to drive until they have been  seizure-free for six months.  Use caution when using heavy equipment or power tools. Avoid working on ladders or at heights. Take showers instead of baths. Ensure the water temperature is not too high on the home water heater. Do not go swimming alone. Do not lock yourself in a room alone (i.e. bathroom). When caring for infants or small children, sit down when holding, feeding, or changing them to minimize risk of injury to the child in the event you have a seizure. Maintain good sleep hygiene. Avoid alcohol.   If you had Gastrointestinal Bleeding: Please ask your Primary MD to check a complete blood count within one week of discharge or at your next visit. Your endoscopic/colonoscopic biopsies that are pending at the time of discharge, will also need to followed by your Primary MD.  Get Medicines reviewed and adjusted. Please take all your medications with you for your next visit with your Primary MD  Please request your Primary MD to go over all hospital tests and procedure/radiological results at the follow up, please ask your Primary MD to get all Hospital records sent to his/her office.  If you experience worsening of your admission symptoms, develop shortness of breath, life threatening emergency, suicidal or homicidal thoughts you must seek medical attention immediately by calling 911 or calling your MD immediately  if symptoms less severe.  You must read complete instructions/literature along with all the possible adverse reactions/side effects for all the Medicines you take  and that have been prescribed to you. Take any new Medicines after you have completely understood and accpet all the possible adverse reactions/side effects.   Do not drive or operate heavy machinery when taking Pain medications.   Do not take more than prescribed Pain, Sleep and Anxiety Medications  Special Instructions: If you have smoked or chewed Tobacco  in the last 2 yrs please stop smoking, stop any regular  Alcohol  and or any Recreational drug use.  Wear Seat belts while driving.  Please note You were cared for by a hospitalist during your hospital stay. If you have any questions about your discharge medications or the care you received while you were in the hospital after you are discharged, you can call the unit and asked to speak with the hospitalist on call if the hospitalist that took care of you is not available. Once you are discharged, your primary care physician will handle any further medical issues. Please note that NO REFILLS for any discharge medications will be authorized once you are discharged, as it is imperative that you return to your primary care physician (or establish a relationship with a primary care physician if you do not have one) for your aftercare needs so that they can reassess your need for medications and monitor your lab values.  You can reach the hospitalist office at phone [phone] or fax [phone]   If you do not have a primary care physician, you can call [phone] for a physician referral.  Activity: As tolerated with Full fall precautions use [place]/cane & assistance as needed    Diet: regular  Disposition Home

## 2019-04-05 NOTE — Evaluation (Signed)
Clinical/Bedside Swallow Evaluation Patient Details  Name: Pamela Dickerson MRN: [id number] Date of Birth: 12/28/1926  Today's Date: 04/05/2019 Time: SLP Start Time (ACUTE ONLY): 1225 SLP Stop Time (ACUTE ONLY): 1325 SLP Time Calculation (min) (ACUTE ONLY): 60 min  Past Medical History:  Past Medical History:  Diagnosis Date  . Anemia   . Glaucoma   . PAD (peripheral artery disease) ([place])   . Thyroid disease    Past Surgical History:  Past Surgical History:  Procedure Laterality Date  . CARDIAC SURGERY     cardiac stent  . EYE SURGERY    . HIP SURGERY    . THYROID SURGERY     HPI:  Pt is a 85 y.o. Caucasian female with a known history of anemia, thyroid disease, glaucoma and peripheral arterial disease, who presented to the emergency room with acute onset of mechanical fall with subsequent left hip pain.  The patient underwent left total hip arthroplasty in the past.  She has been having unsteady gait lately.  Pt is HOH w/ HAs.  Pt appears to have significant Cognitive decline - unsure if pt has a baseline Dementia(?).  Head CT: Generalized atrophy. No sign of recent infarction.  CXR today: No acute cardiopulmonary disease.  Pt has been agitated requiring Supervision during this admission. Noted pt taking off her gown and fidgity in the bed; removing [place] O2 support and would not put it back on despite verbal encouragement.   Assessment / Plan / Recommendation Clinical Impression  Pt appears to present w/ grossly adequate oropharyngeal phase swallowing function w/ No immediate, overt clinical s/s of aspiration w/ po trials including thin liquids and ice cream at this assessment today. Pt did NOT accept any further po trials - w/ the ones accepted, she required MOD+ verbal/visual encouragement. She appears to present Cognitive decline. Adequate oral phase bolus management and clearing - LOOSE DENTURES NOTE which can impair mastication of solid foods. No immediate, overt clinical s/s of  aspiration noted w/ po trials of thin liquids via Straw then ice cream -- pt refused all other po offerings. No immediate coughing or wet vocal quality or decline in respiratory effort when she verbally engaged w/ SLP after trials. No significant oral residue left when given time to clear; pt used lingual sweeping to aid clearing. Timely management noted w/ the thin liquids. OM exam appeared grossly [facility] w/ no unilateral weakness assessed. Pt required setup support and encouragement for oral intake -- she did not immediately engage w/ self-feeding w/ po's. Recommend a Dysphagia level 3 (w/ well-cut meats) and even level 2 (Minced foods) diet w/ thin liquids; monitoring for toleration of thin liquids following general aspiration precautions. Recommmend Pills in Puree - whole vs Crushed as needed for ease/safety when swallowing. Setup support at meals. SECURE DENTURES at meals. ST services can be available for further education on precautions if needed. NSG updated. Handouts given for d/c home w/ family. SLP Visit Diagnosis: Dysphagia, unspecified (R13.10)(cognitive decline)    Aspiration Risk  Mild aspiration risk;Risk for inadequate nutrition/hydration(reduced when following precautions, supporting)    Diet Recommendation  Dysphagia level 3 diet w/ Thin liquids; aspiration precautions; support and monitoring at meals; secure Dentures.   Medication Administration: Crushed with puree(vs Whole - for safer swallowing)    Other  Recommendations Recommended Consults: (Dietician f/u) Oral Care Recommendations: Oral care BID;Staff/trained caregiver to provide oral care Other Recommendations: (n/a)   Follow up Recommendations None      Frequency and Duration (n/a)  (n/a)  Prognosis Prognosis for Safe Diet Advancement: Fair Barriers to Reach Goals: Cognitive deficits;Severity of deficits;Time post onset      Swallow Study   General Date of Onset: 04/02/19 HPI: Pt is a 85 y.o. Caucasian female  with a known history of anemia, thyroid disease, glaucoma and peripheral arterial disease, who presented to the emergency room with acute onset of mechanical fall with subsequent left hip pain.  The patient underwent left total hip arthroplasty in the past.  She has been having unsteady gait lately.  Pt is HOH w/ HAs.  Pt appears to have significant Cognitive decline - unsure if pt has a baseline Dementia(?).  Head CT: Generalized atrophy. No sign of recent infarction.  CXR today: No acute cardiopulmonary disease.  Pt has been agitated requiring Supervision during this admission. Noted pt taking off her gown and fidgity in the bed; removing  O2 support and would not put it back on despite verbal encouragement. Type of Study: Bedside Swallow Evaluation Previous Swallow Assessment: none noted Diet Prior to this Study: Regular;Thin liquids Temperature Spikes Noted: No(wbc 8.5) Respiratory Status: Nasal cannula(2L) History of Recent Intubation: No Behavior/Cognition: Alert;Cooperative;Pleasant mood;Confused;Distractible;Requires cueing;Doesn't follow directions(inappropriate laughter during engagement ) Oral Cavity Assessment: (CNT d/t cooperation ) Oral Care Completed by SLP: Recent completion by staff Oral Cavity - Dentition: Dentures, top;Dentures, bottom(LOOSE) Vision: (unknown) Self-Feeding Abilities: Needs assist;Needs set up;Total assist(too distracted) Patient Positioning: Upright in bed(needed positioning) Baseline Vocal Quality: Normal Volitional Cough: Cognitively unable to elicit Volitional Swallow: Unable to elicit    Oral/Motor/Sensory Function Overall Oral Motor/Sensory Function: Within functional limits(w/ bolus management, clearing, and speech)   Ice Chips Ice chips: Not tested Other Comments: refused   Thin Liquid Thin Liquid: Within functional limits Presentation: Straw(4 sips only accepted) Other Comments: no overt clinical s/s of aspiration noted w/ sips of soda, then ice  cream    Nectar Thick Nectar Thick Liquid: Not tested   Honey Thick Honey Thick Liquid: Not tested   Puree Puree: Not tested Other Comments: but frozen ice cream given w/ good oral management to break it down initially   Solid     Solid: Not tested        Jerilynn Som, MS, CCC-SLP Deliah Strehlow 04/05/2019,3:10 PM

## 2019-04-05 NOTE — Discharge Summary (Addendum)
Physician Discharge Summary  Pamela Dickerson SWN:[id number] DOB: Nov 21, 1926 DOA: 04/02/2019  PCP: Marzetta Board, DO  Admit date: 04/02/2019 Discharge date: 04/05/2019  Admitted From: home Disposition:  home  Recommendations for Outpatient Follow-up:  1. Follow up with PCP in 1-2 weeks 2. Follow-up with orthopedic surgery in 4 weeks for repeat chest x-ray and evaluation  Home Health: PT, OT, RN, aide Equipment/Devices: Hospital bed, Hoyer lift, 3 and 1, bedside commode  Discharge Condition: Stable CODE STATUS: Partial code, no CPR but okay for short-term intubation Diet recommendation: Regular diet  HPI: Per admitting MD, Pamela Dickerson  is a 85 y.o. Caucasian female with a known history of anemia, thyroid disease, glaucoma and peripheral arterial disease, who presented to the emergency room with acute onset of mechanical fall with subsequent left hip pain.  The patient underwent left total hip arthroplasty in the past.  She has been having unsteady gait lately.  Denies any presyncope or syncope.  No headache or dizziness or blurred vision.  No paresthesias or focal muscle weakness.  Most of the history is taken from her daughter as the patient has hearing loss. Upon presentation to the emergency room vital signs were within normal labs were unremarkable except for mild leukocytosis and anemia for which x-ray showed no acute cardiopulmonary disease.  Left hip x-ray showed left hip hemiarthroplasty and possible nondisplaced periprostatic fracture through the greater trochanter with adjacent soft tissue swelling and possible hematoma and diffuse bony demineralization that may limit detection of additional subtle nondisplaced fracture.  Head CT scan revealed nondisplaced periprostatic fracture through that seen on prior plain film.  There is also question about superior and inferior pubic rami fractures without significant displacement. The patient was given 4 mg IV morphine sulfate and 4 mg of IV Zofran  as well as 5 mg p.o. Roxicodone and was started on IV normal saline at 125 mL/h.  Dr. Roland Rack was notified about the patient and is aware.  She will be admitted to a MedSurg bed for further evaluation and management.  Hospital Course / Discharge diagnoses: Principal Problem Left greater trochanteric hip fracture, secondary to mechanical fall History of left hip hemiarthroplasty -Orthopedic surgery consulted and evaluated patient.  For now recommending nonoperative management, mobilizing with physical therapy as tolerated, toe-touch weightbearing on left with gradual progression of weightbearing over several weeks.  Physical therapy consulted and evaluated patient, recommended SNF level of care, however due to COVID-19 pandemic and visitor restrictions daughter prefers to take patient home.  Full support as possible was arranged, with hospital bed, home health, and she will be discharged home in stable conditio  Active Problems Acute on chronic hypoxic respiratory failure, underlying COPD-likely also in the setting of poor mobility, bedridden status currently, chest x-ray without pneumonia or any other abnormalities, also atelectasis plays a role Acute on chronic anemia -Baseline hemoglobin in the past couple years appears to be 10-11.  Currently in the 8 range, anemia panel this morning shows iron deficiency.  Continue iron supplementation.  No evidence of bleeding, CBCs have been stable Hypertension -Continue home medications Hypothyroidism -continue levothyroxine GERD -chronic. Continue home PPI Glaucoma -continue ophthalmic drops Overactive bladder -continue home Ditropan Anxiety -chronic, stable. Continue home Ativan, but hold if sedated with pain meds  Discharge Instructions   Allergies as of 04/05/2019      Reactions   Amoxicillin    Beef-derived Products    Pork-derived Products       Medication List    TAKE these medications  acetaminophen 650 MG CR tablet Commonly known  as: TYLENOL Scheduled BID and up to another 2 X daily as needed for pain   Advair Diskus 250-50 MCG/DOSE Aepb Generic drug: Fluticasone-Salmeterol Inhale 1 puff into the lungs daily.   albuterol 108 (90 Base) MCG/ACT inhaler Commonly known as: VENTOLIN HFA Inhale 1-2 puffs into the lungs every 4 (four) hours as needed.   brimonidine 0.15 % ophthalmic solution Commonly known as: ALPHAGAN Administer 1 drop into the left eye Two (2) times a day.   clopidogrel 75 MG tablet Commonly known as: PLAVIX Take 75 mg by mouth daily.   ferrous sulfate 325 (65 FE) MG EC tablet Take 325 mg by mouth daily with breakfast.   ketorolac 10 MG tablet Commonly known as: TORADOL Take 1 tablet (10 mg total) by mouth every 6 (six) hours as needed for up to 5 days.   levothyroxine 100 MCG tablet Commonly known as: SYNTHROID Take 100 mcg by mouth daily before breakfast.   LORazepam 1 MG tablet Commonly known as: ATIVAN 1/2 to 1 tablet every 8 hours prn (max total daily dose 2.5 mg)   Melatonin 3 MG Caps Take 3 mg by mouth at bedtime.   metoprolol succinate 50 MG 24 hr tablet Commonly known as: TOPROL-XL Take 50 mg by mouth daily.   multivitamin with minerals tablet Take 1 tablet by mouth daily.   nitroGLYCERIN 0.4 MG SL tablet Commonly known as: NITROSTAT Place 0.4 mg under the tongue every 5 (five) minutes as needed.   nystatin 100000 UNIT/ML suspension Commonly known as: MYCOSTATIN Take 5 mLs by mouth 4 (four) times daily.   omeprazole 20 MG capsule Commonly known as: PRILOSEC Take 20 mg by mouth daily.   oxybutynin 5 MG 24 hr tablet Commonly known as: DITROPAN-XL Take 5 mg by mouth at bedtime.   PARoxetine 40 MG tablet Commonly known as: PAXIL Take 40 mg by mouth every morning.   pramipexole 0.125 MG tablet Commonly known as: MIRAPEX Take 0.125 mg by mouth 3 (three) times daily.   pravastatin 40 MG tablet Commonly known as: PRAVACHOL Take 40 mg by mouth daily.     Refresh Liquigel 1 % Gel Generic drug: Carboxymethylcellulose Sodium Administer 1 drop to both eyes Three (3) times a day.   traMADol 50 MG tablet Commonly known as: ULTRAM Take 1 tablet (50 mg total) by mouth every 6 (six) hours as needed for moderate pain (pain not controlled by scheduled Tylenol).   Travoprost (BAK Free) 0.004 % Soln ophthalmic solution Commonly known as: TRAVATAN INSTILL 1 DROP IN BOTH EYES EVERY EVENING   Vitamin D 50 MCG (2000 UT) tablet Take 2,000 Units by mouth daily.            Durable Medical Equipment  (From admission, onward)         Start     Ordered   04/05/19 1145  For home use only DME oxygen  Once    Question Answer Comment  Length of Need 6 Months   Mode or (Route) Nasal cannula   Liters per Minute 2   Frequency Continuous (stationary and portable oxygen unit needed)   Oxygen delivery system Gas      04/05/19 1144   04/04/19 1432  For home use only DME Bedside commode  Once    Question:  Patient needs a bedside commode to treat with the following condition  Answer:  Hip fracture (HCC)   04/04/19 1432   04/04/19 1431  For home  use only DME Hospital bed  Once    Question Answer Comment  Length of Need Lifetime   Patient has (list medical condition): non operative trochanter fracture   The above medical condition requires: Patient requires the ability to reposition frequently   Head must be elevated greater than: 30 degrees   Bed type Semi-electric   Hoyer Lift Yes   Support Surface: Gel Overlay      04/04/19 1432         Follow-up Information    Patriciaann ClanButler, Erik, DO. Schedule an appointment as soon as possible for a visit in 4 week(s).   Specialty: Family Medicine Contact information: [address] [place] [address] [phone]        Poggi, Excell SeltzerJohn J, MD. Schedule an appointment as soon as possible for a visit in 4 week(s).   Specialty: Orthopedic Surgery Why: for repeat XRAY and evaluation Contact  information: [address] [facility] [place] [id number] [phone]           Consultations:  Orthopedic surgery  Procedures/Studies:  Ct Head Wo Contrast  Result Date: 04/02/2019 CLINICAL DATA:  Unresponsive beginning about 1500 hours. Fell yesterday. EXAM: CT HEAD WITHOUT CONTRAST TECHNIQUE: Contiguous axial images were obtained from the base of the skull through the vertex without intravenous contrast. COMPARISON:  03/08/2016 FINDINGS: Brain: Generalized atrophy. No sign of recent infarction, intra-axial mass lesion, hemorrhage, hydrocephalus or extra-axial collection. Ordinary mild chronic small-vessel change of the white matter, typical for age. Insignificant calcified 2 cm meningioma projecting towards the left from the superior falx. No mass-effect upon the brain. Vascular: There is atherosclerotic calcification of the major vessels at the base of the brain. Skull: Negative Sinuses/Orbits: Clear/normal Other: None IMPRESSION: No acute finding by CT. Generalized atrophy. Ordinary chronic small-vessel change of the white matter, typical for age. Electronically Signed   By: Paulina FusiMark  Shogry M.D.   On: 04/02/2019 17:36   Ct Hip Left Wo Contrast  Result Date: 04/02/2019 CLINICAL DATA:  Recent fall with periprosthetic fracture on recent plain film EXAM: CT OF THE LEFT HIP WITHOUT CONTRAST TECHNIQUE: Multidetector CT imaging of the left hip was performed according to the standard protocol. Multiplanar CT image reconstructions were also generated. COMPARISON:  Plain film from the previous day FINDINGS: Bones/Joint/Cartilage Changes consistent with left hip replacement are noted. There is a periprosthetic fracture in the proximal femur without significant displacement. Suggestion of undisplaced fractures of the left superior and inferior pubic rami are seen as well. Ligaments Suboptimally assessed by CT. Muscles and Tendons Surrounding musculature is within normal limits. Soft  tissues Focal hematoma is noted adjacent to the greater trochanter laterally related to the recent injury. IMPRESSION: Undisplaced periprosthetic fracture similar to that seen on prior plain film examination. There is also suspicion for superior and inferior pubic rami fractures without significant displacement. Electronically Signed   By: Alcide CleverMark  Lukens M.D.   On: 04/02/2019 01:43   Dg Chest Port 1 View  Result Date: 04/05/2019 CLINICAL DATA:  Hypoxemia. EXAM: PORTABLE CHEST 1 VIEW COMPARISON:  04/02/2019 and older exams. FINDINGS: Cardiac silhouette normal in size. No mediastinal or hilar masses. No evidence of adenopathy. Vascular prominence bilaterally, similar to the prior study, with mild interstitial prominence. No overt pulmonary edema. No evidence of pneumonia. No visualized pleural effusion and no pneumothorax. Skeletal structures are grossly intact. IMPRESSION: No acute cardiopulmonary disease. Electronically Signed   By: Amie Portlandavid  Ormond M.D.   On: 04/05/2019 09:49   [facility]  [facility]  Result Date: 04/02/2019 CLINICAL DATA:  Preop EXAM: PORTABLE CHEST 1 VIEW COMPARISON:  None. FINDINGS: The heart size and mediastinal contours are within normal limits. Aortic knob calcifications are seen. Mildly increased interstitial markings seen at both lung bases as on the prior exam. No acute osseous abnormality. IMPRESSION: No acute cardiopulmonary process. Electronically Signed   By: Jonna Clark M.D.   On: 04/02/2019 00:48   Dg Hip Unilat With Pelvis 2-3 Views Left  Result Date: 04/01/2019 CLINICAL DATA:  Hip pain, fall EXAM: DG HIP (WITH OR WITHOUT PELVIS) 2-3V LEFT COMPARISON:  Radiograph 03/10/2017 FINDINGS: The osseous structures appear diffusely demineralized which may limit detection of small or nondisplaced fractures. There are postsurgical changes from prior left hip hemiarthroplasty in expected alignment when compared to prior radiographs. There is a questionable fracture lucency through the  greater trochanter which may reflect a nondisplaced periprosthetic fracture with overlying soft tissue swelling and thickening. Bones of the pelvis are congruent. Arcuate lines are congruent. Right femoral head is normally located. Atherosclerotic calcifications are noted in the pelvis. IMPRESSION: 1. Left hip hemiarthroplasty with possible nondisplaced periprosthetic fracture through the greater trochanter. 2. Adjacent soft tissue swelling and possible hematoma. 3. Diffuse bony demineralization which may limit detection of additional subtle nondisplaced fractures. Electronically Signed   By: Kreg Shropshire M.D.   On: 04/01/2019 20:27      Subjective: - no chest pain, shortness of breath, no abdominal pain, nausea or vomiting.   Discharge Exam: BP (!) 146/78 (BP Location: Right Arm)    Pulse 98    Temp 98.1 F (36.7 C) (Oral)    Resp 18    Ht 5' (1.524 m)    Wt 59.9 kg    SpO2 95%    BMI 25.78 kg/m   General: Pt is alert, awake, not in acute distress Cardiovascular: RRR, S1/S2 +, no rubs, no gallops Respiratory: CTA bilaterally, no wheezing, no rhonchi Abdominal: Soft, NT, ND, bowel sounds + Extremities: no edema, no cyanosis    The results of significant diagnostics from this hospitalization (including imaging, microbiology, ancillary and laboratory) are listed below for reference.     Microbiology: Recent Results (from the past 240 hour(s))  SARS CORONAVIRUS 2 (TAT 6-24 HRS) Nasopharyngeal Nasopharyngeal Swab     Status: None   Collection Time: 04/02/19  3:58 AM   Specimen: Nasopharyngeal Swab  Result Value Ref Range Status   SARS Coronavirus 2 NEGATIVE NEGATIVE Final    Comment: (NOTE) SARS-CoV-2 target nucleic acids are NOT DETECTED. The SARS-CoV-2 RNA is generally detectable in upper and lower respiratory specimens during the acute phase of infection. Negative results do not preclude SARS-CoV-2 infection, do not rule out co-infections with other pathogens, and should not be  used as the sole basis for treatment or other patient management decisions. Negative results must be combined with clinical observations, patient history, and epidemiological information. The expected result is Negative. Fact Sheet for Patients: HairSlick.no Fact Sheet for Healthcare Providers: quierodirigir.com This test is not yet approved or cleared by the [place] FDA and  has been authorized for detection and/or diagnosis of SARS-CoV-2 by FDA under an Emergency Use Authorization (EUA). This EUA will remain  in effect (meaning this test can be used) for the duration of the COVID-19 declaration under Section 56 4(b)(1) of the Act, 21 U.S.C. section 360bbb-3(b)(1), unless the authorization is terminated or revoked sooner. Performed at [facility], [address] [place], [place] [address]  Labs: Basic Metabolic Panel: Recent Labs  Lab 04/02/19 0512 04/02/19 1533 04/03/19 0425 04/04/19 0406 04/05/19 0333  NA 139 140 139 138 138  K 5.4* 4.4   4.3 4.3 4.2 4.0  CL 104 106 106 105 105  CO2 GLUCOSE 138* 120* 120* 136* 118*  BUN 24* 24* 27* 32* 38*  CREATININE 0.76 0.81 0.68 0.76 0.92  CALCIUM 8.5* 8.4* 8.0* 8.5* 8.7*   Liver Function Tests: Recent Labs  Lab 04/02/19 0100  AST 22  ALT 15  ALKPHOS 115  BILITOT 0.5  PROT 6.8  ALBUMIN 3.8   CBC: Recent Labs  Lab 04/02/19 0100 04/02/19 0512 04/03/19 0425 04/04/19 0406 04/05/19 0333  WBC 11.6* 10.5 6.1 7.2 8.5  NEUTROABS 10.0*  --  4.6 5.8  --   HGB 10.1* 9.5* 7.9* 8.2* 8.9*  HCT 33.2* 32.2* 26.4* 28.3* 29.0*  MCV 77.6* 81.3 79.3* 82.0 79.0*  PLT 241 230 155 173 179   CBG: Recent Labs  Lab 04/02/19 1549  GLUCAP 115*   Hgb A1c No results for input(s): HGBA1C in the last 72 hours. Lipid Profile No results for input(s): CHOL, HDL, LDLCALC, TRIG, CHOLHDL, LDLDIRECT in the last 72 hours. Thyroid function studies No  results for input(s): TSH, T4TOTAL, T3FREE, THYROIDAB in the last 72 hours.  Invalid input(s): FREET3 Urinalysis No results found for: COLORURINE, APPEARANCEUR, LABSPEC, PHURINE, GLUCOSEU, HGBUR, BILIRUBINUR, KETONESUR, PROTEINUR, UROBILINOGEN, NITRITE, LEUKOCYTESUR  FURTHER DISCHARGE INSTRUCTIONS:   Get Medicines reviewed and adjusted: Please take all your medications with you for your next visit with your Primary MD   Laboratory/radiological data: Please request your Primary MD to go over all hospital tests and procedure/radiological results at the follow up, please ask your Primary MD to get all Hospital records sent to his/her office.   In some cases, they will be blood work, cultures and biopsy results pending at the time of your discharge. Please request that your primary care M.D. goes through all the records of your hospital data and follows up on these results.   Also Note the following: If you experience worsening of your admission symptoms, develop shortness of breath, life threatening emergency, suicidal or homicidal thoughts you must seek medical attention immediately by calling 911 or calling your MD immediately  if symptoms less severe.   You must read complete instructions/literature along with all the possible adverse reactions/side effects for all the Medicines you take and that have been prescribed to you. Take any new Medicines after you have completely understood and accpet all the possible adverse reactions/side effects.    Do not drive when taking Pain medications or sleeping medications (Benzodaizepines)   Do not take more than prescribed Pain, Sleep and Anxiety Medications. It is not advisable to combine anxiety,sleep and pain medications without talking with your primary care practitioner   Special Instructions: If you have smoked or chewed Tobacco  in the last 2 yrs please stop smoking, stop any regular Alcohol  and or any Recreational drug use.   Wear Seat belts  while driving.   Please note: You were cared for by a hospitalist during your hospital stay. Once you are discharged, your primary care physician will handle any further medical issues. Please note that NO REFILLS for any discharge medications will be authorized once you are discharged, as it is imperative that you return to your primary care physician (or establish a relationship with a primary care physician if you do not have one) for  your post hospital discharge needs so that they can reassess your need for medications and monitor your lab values.  Time coordinating discharge: 35 minutes  SIGNED:  Pamella Pert, MD, PhD 04/05/2019, 11:50 AM

## 2019-04-05 NOTE — Progress Notes (Signed)
EMS here to transport pt, Daughter Juliann Pulse notified.

## 2019-04-05 NOTE — Progress Notes (Signed)
Restless, agitated when touched, screaming to be left alone. Will attempt to recollect when calm.

## 2019-04-05 NOTE — Progress Notes (Signed)
NP Pamela Dickerson notified Pt refused IV stick. Patient agitated when touched but calm and resting when not stimulated. Patient alert to self but very confused and combative at this time. Pamela Dickerson aware of VS. Will continue to monitor.

## 2019-04-05 NOTE — Progress Notes (Signed)
Daughter Pamela Dickerson given discharge instructions over the phone. She verbalized understanding, all questions answered.

## 2019-04-05 NOTE — TOC Transition Note (Signed)
Transition of Care [facility]) - CM/SW Discharge Note   Patient Details  Name: Pamela Dickerson MRN: [id number] Date of Birth: 1926/06/28  Transition of Care [facility]) CM/SW Contact:  Pamela Dickerson, Pamela Dickerson Phone Number: [phone]  04/05/2019, 1:02 PM   Clinical Narrative: Patient is medically stable for D/C home today with [facility] home health PT, OT, RN, aide and social work. Pamela Dickerson home health representative is aware of D/C today. Per [place] DME agency representative patient's hospital bed, hoyer lift and oxygen will be delivered to patient's home around 1 pm. Patient's daughter Pamela Dickerson is aware of above and requested EMS for transport. EMS form completed. RN will arrange EMS for transport home. Please reconsult if future social work needs arise. CSW signing off.       Final next level of care: [place] Barriers to Discharge: Barriers Resolved   Patient Goals and CMS Choice Patient states their goals for this hospitalization and ongoing recovery are:: Patient's daughter would like to be able to take her back home at some point, understands that she may need short term rehab. CMS Medicare.gov Compare Post Acute Care list provided to:: Patient Choice offered to / list presented to : Adult Children  Discharge Placement                Patient to be transferred to facility by: [facility] Name of family member notified: Patient's daughter Pamela Dickerson is aware of D/C today. Patient and family notified of of transfer: 04/05/19  Discharge Plan and Services   Discharge Planning Services: CM Consult Post Acute Care Choice: Home Health          DME Arranged: Oxygen, Hospital bed DME Agency: [employer] Date DME Agency Contacted: 04/05/19 Time DME Agency Contacted: 109 Representative spoke with at DME Agency: [place]: RN, PT, OT, Nurse's Aide, Social Work [employer] Agency: [place] Date [place]: 04/05/19 Time [place]: 1152 Representative spoke with at [place]: [place] ([place]) Interventions     Readmission Risk Interventions No flowsheet data found.

## 2019-04-05 NOTE — Progress Notes (Signed)
PT Cancellation Note  Patient Details Name: Pamela Dickerson MRN: [id number] DOB: [birth year]   Cancelled Treatment:    Reason Eval/Treat Not Completed: Pain limiting ability to participate   Pt in bed, daughter in room.  Daughter stated she anticipates discharge home today with HHPT.  Attempted ROM ex but pt yelling out in pain at just light touch to feet.  RN in to give pain medication.  Pt on 4 lpm in room.  Sats 93% room air but O2 cannula placed 1/2 in by duaghter - stated pt does not play with it as much when it is not fully in her nose.  O2 removed and decreased to 87% when O2 was placed back on.   Chesley Noon 04/05/2019, 12:03 PM

## 2019-04-05 NOTE — Progress Notes (Signed)
SATURATION QUALIFICATIONS:    Patient Saturations on Room Air at Rest =87 %  Patient Saturations on 2 Liters of oxygen at Rest = 96 %  Please briefly explain why patient needs home oxygen:   desaturation at rest

## 2021-01-07 IMAGING — CR DG HIP (WITH OR WITHOUT PELVIS) 2-3V*L*
1 series · 3 of 3 positions shown · non-contrast
Comparison: Radiograph 03/10/2017

CLINICAL DATA: Hip pain, fall

EXAM:
DG HIP (WITH OR WITHOUT PELVIS) 2-3V LEFT

[Series 1: dg hip unilat w or w/o pelvis 2-3 views  · non-contrast · 0.14mm/px · 3 of 3 slices shown]
[im 1/3]
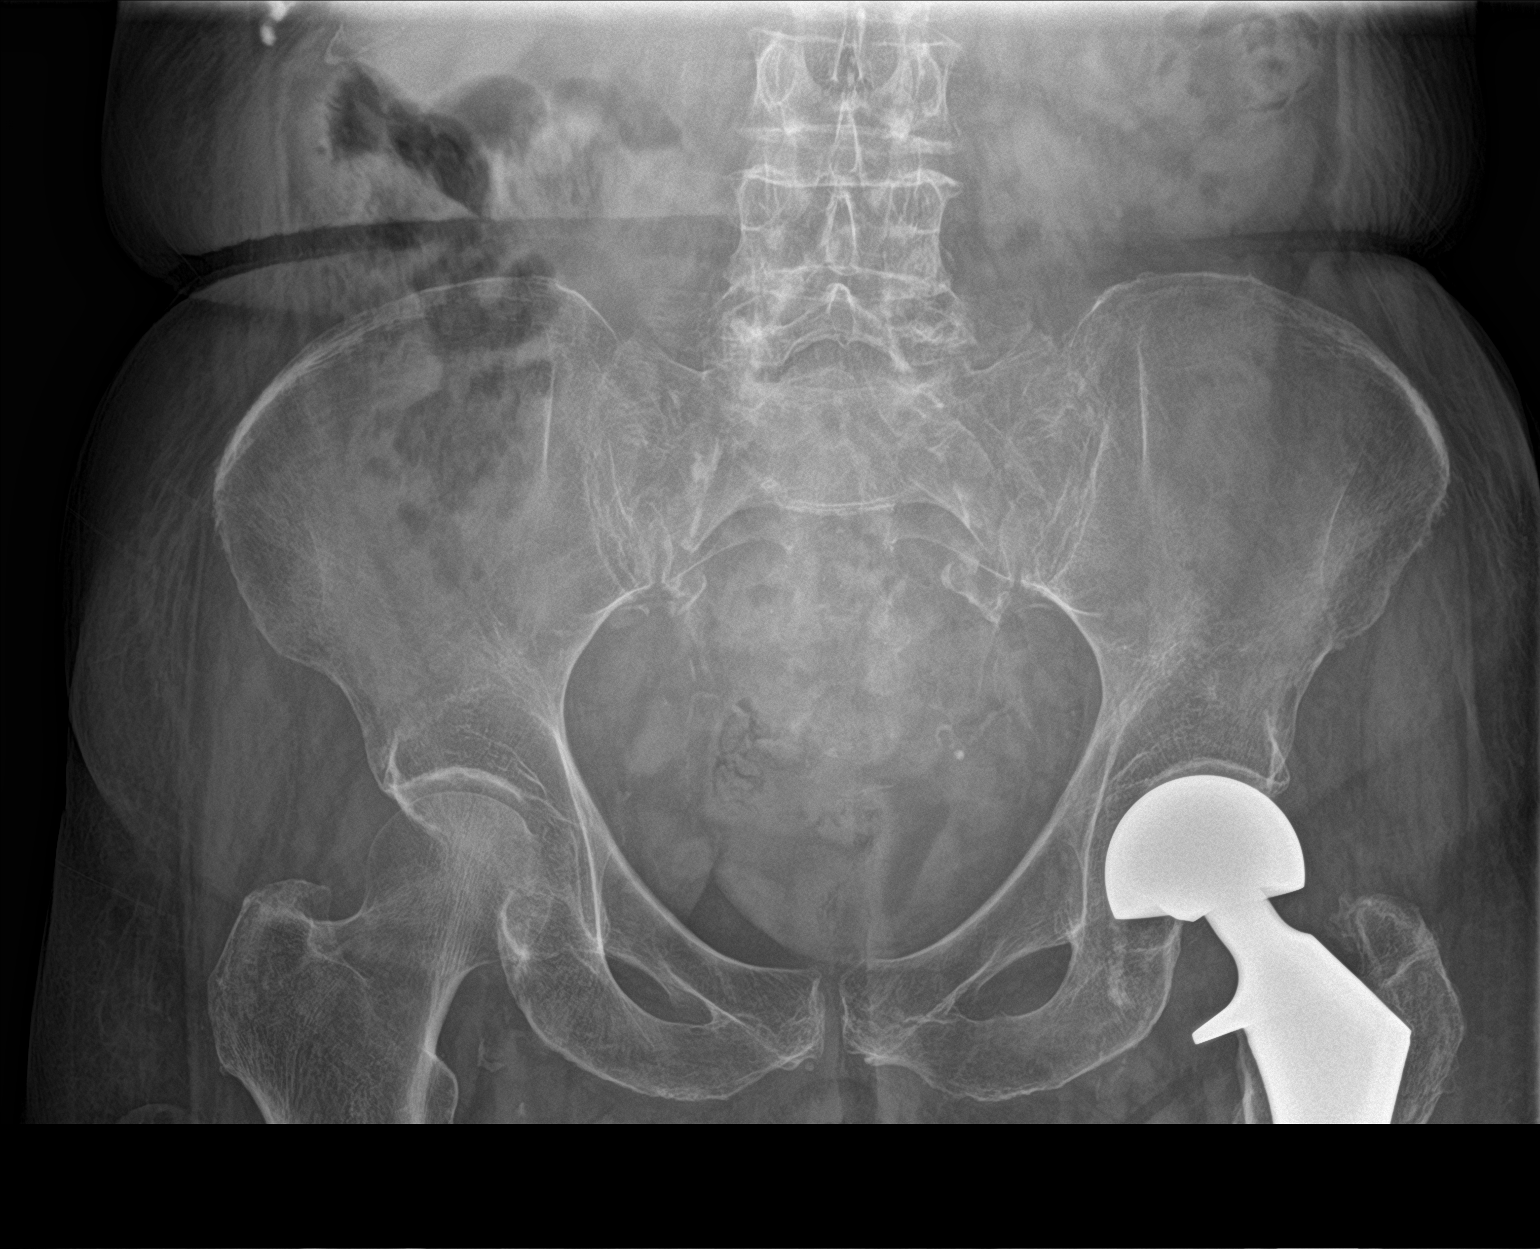
[im 2/3]
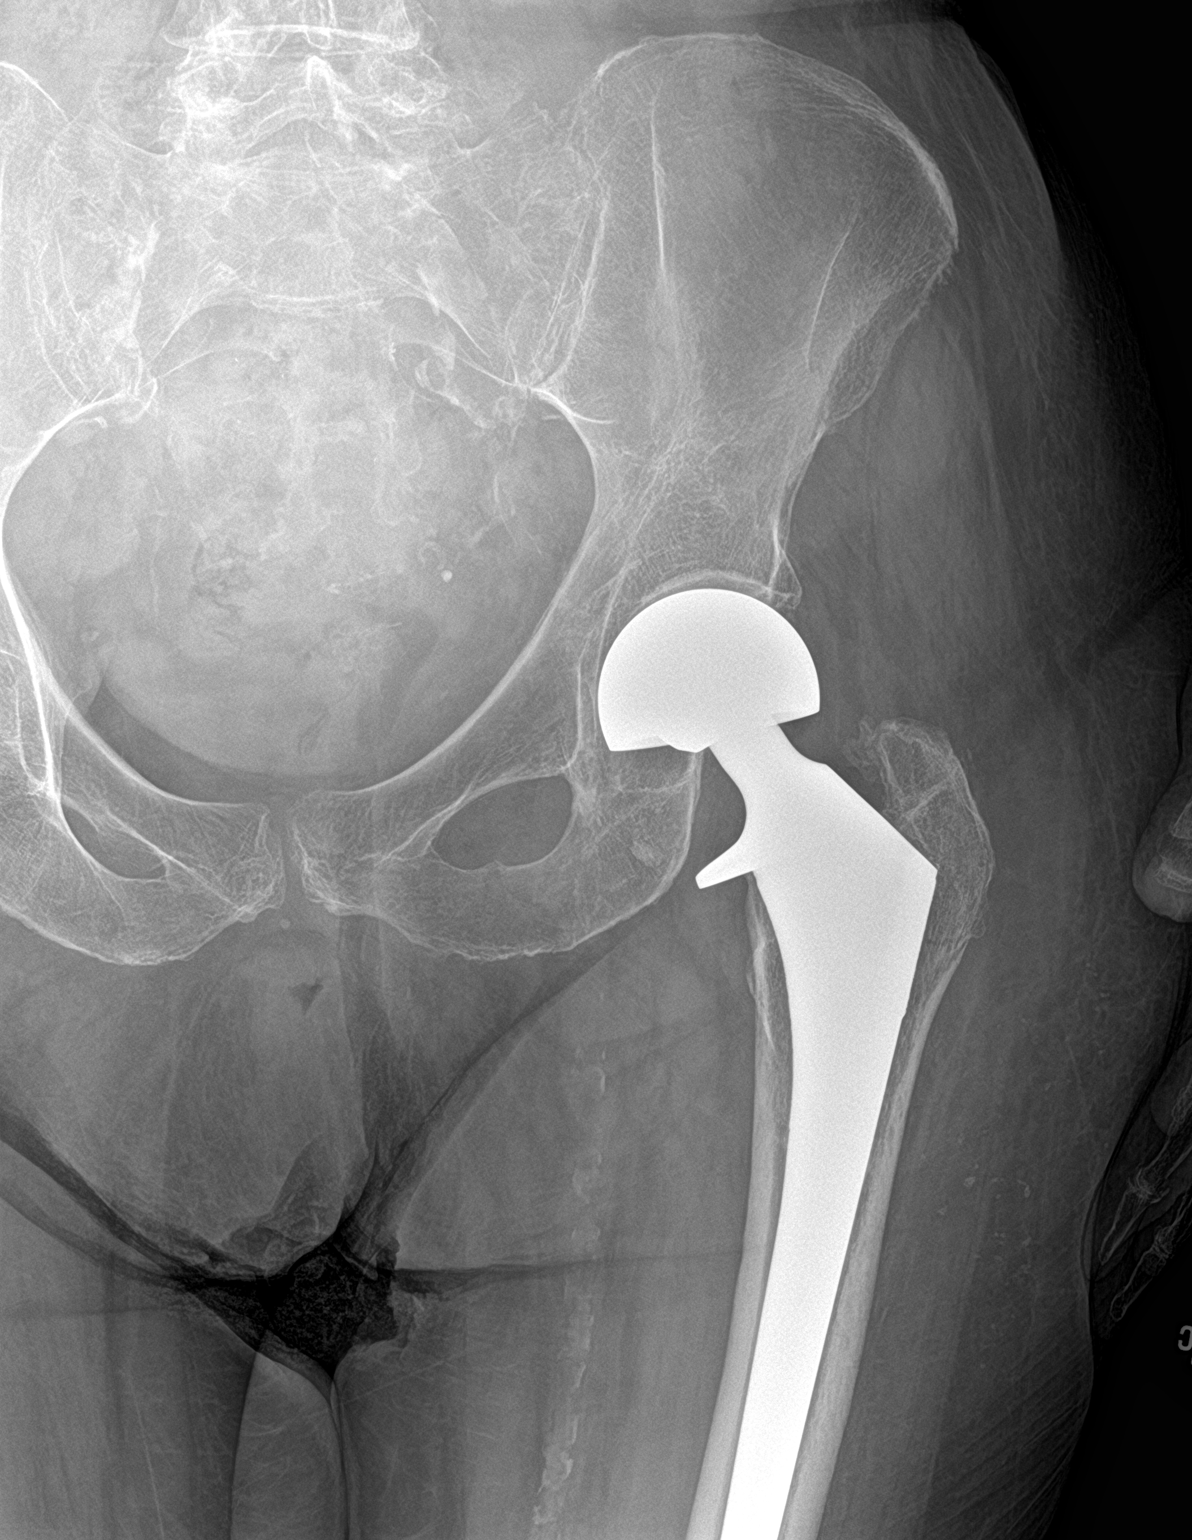
[im 3/3]
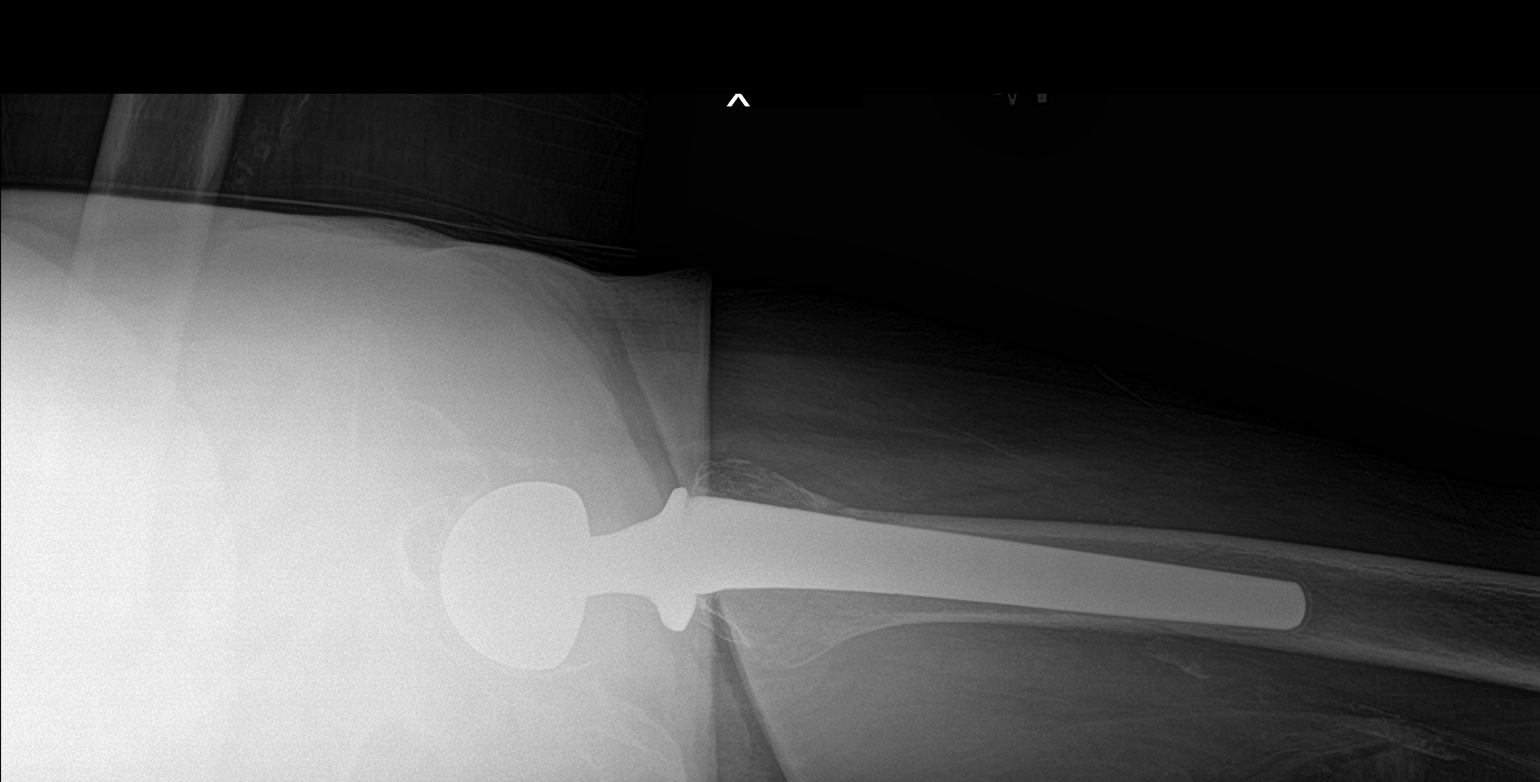

[3 of 3 positions shown; findings below may reference images not displayed]

FINDINGS: The osseous structures appear diffusely demineralized which may
limit detection of small or nondisplaced fractures. There are
postsurgical changes from prior left hip hemiarthroplasty in
expected alignment when compared to prior radiographs. There is a
questionable fracture lucency through the greater trochanter which
may reflect a nondisplaced periprosthetic fracture with overlying
soft tissue swelling and thickening. Bones of the pelvis are
congruent. Arcuate lines are congruent. Right femoral head is
normally located. Atherosclerotic calcifications are noted in the
pelvis.
IMPRESSION: 1. Left hip hemiarthroplasty with possible nondisplaced
periprosthetic fracture through the greater trochanter.
2. Adjacent soft tissue swelling and possible hematoma.
3. Diffuse bony demineralization which may limit detection of
additional subtle nondisplaced fractures.

## 2021-01-08 IMAGING — CT CT HEAD W/O CM
3 of 7 series · 14 of 47 positions shown, 17 images · non-contrast
Comparison: 03/08/2016

CLINICAL DATA: Unresponsive beginning about 4388 hours. Fell
yesterday.

EXAM:
CT HEAD WITHOUT CONTRAST
TECHNIQUE: Contiguous axial images were obtained from the base of the skull
through the vertex without intravenous contrast.

[Series 3: head wo · axial · 0.43mm/px · z∈[-170,-50]mm · 9 of 30 slices shown, 12 images]
[im 3/30  brain]
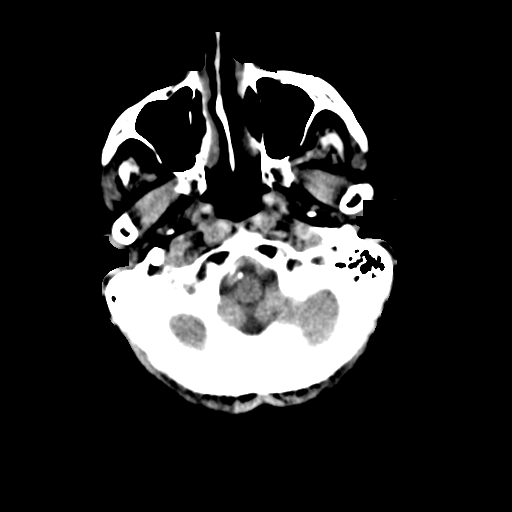
[im 3/30  bone]
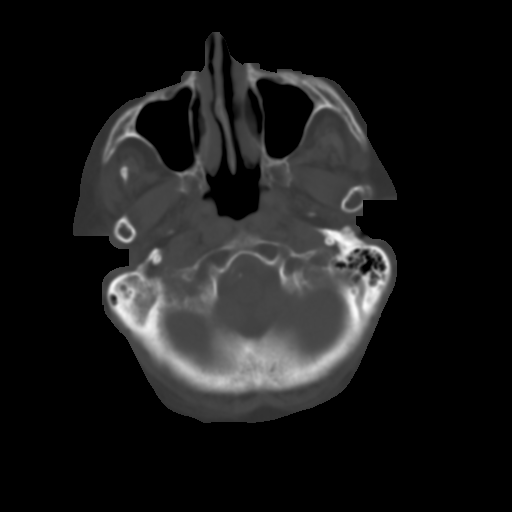
[im 6/30  brain]
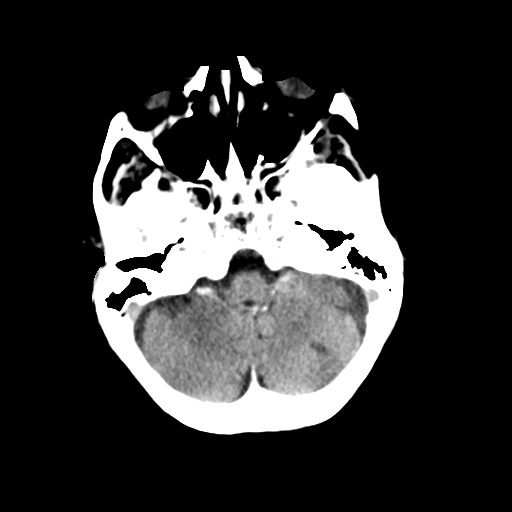
[im 9/30  brain]
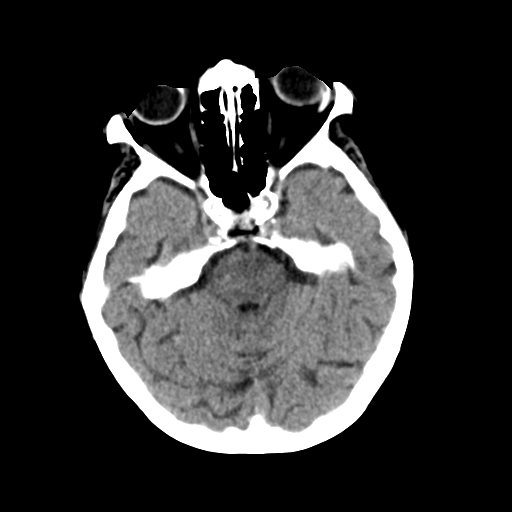
[im 12/30  brain]
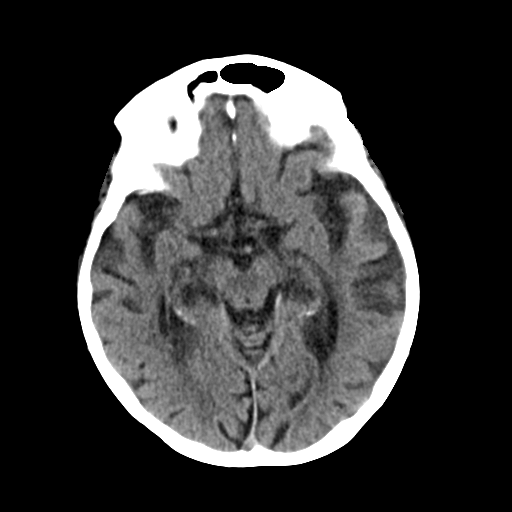
[im 15/30  brain]
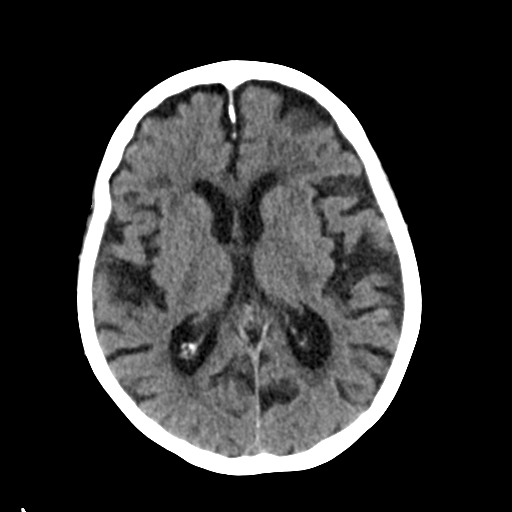
[im 15/30  bone]
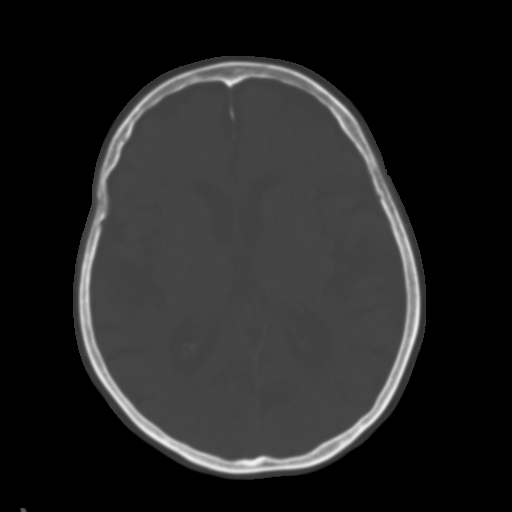
[im 18/30  brain]
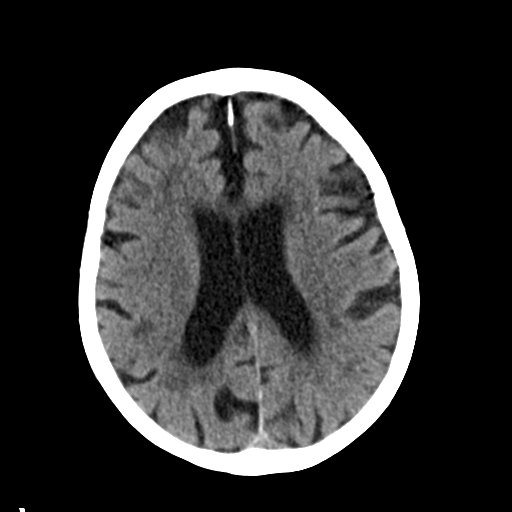
[im 21/30  brain]
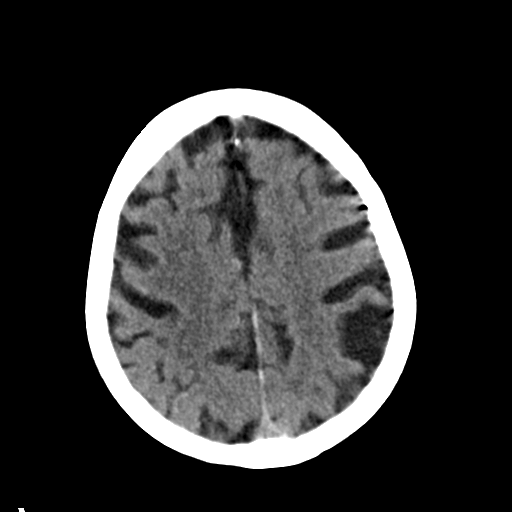
[im 24/30  brain]
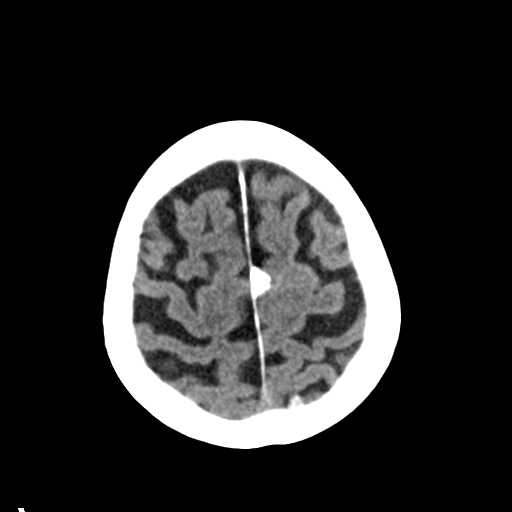
[im 27/30  brain]
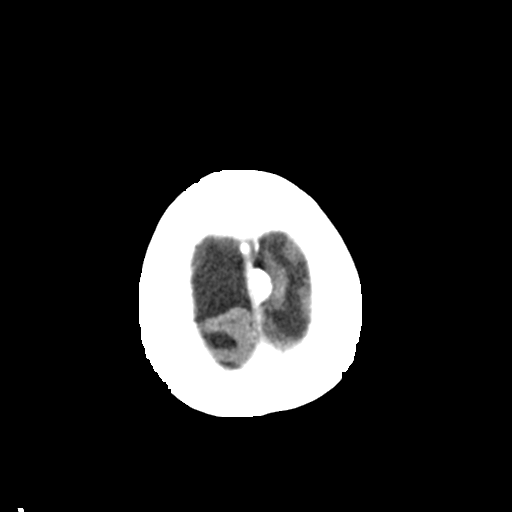
[im 27/30  bone]
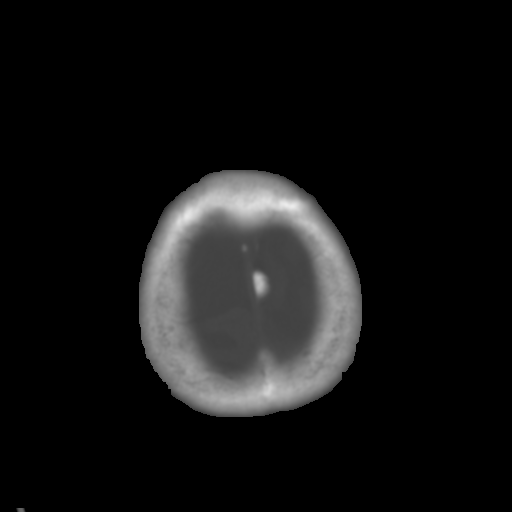

[Series 4: coronal soft tissue · coronal · 0.29mm/px · 3 of 58 slices shown]
[im 15/58  brain]
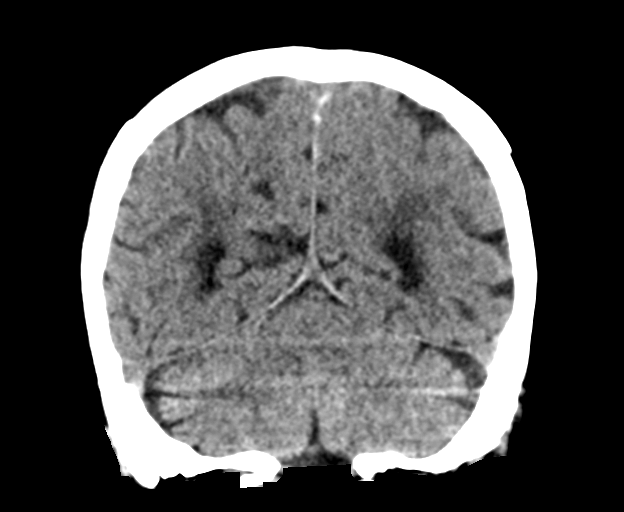
[im 29/58  brain]
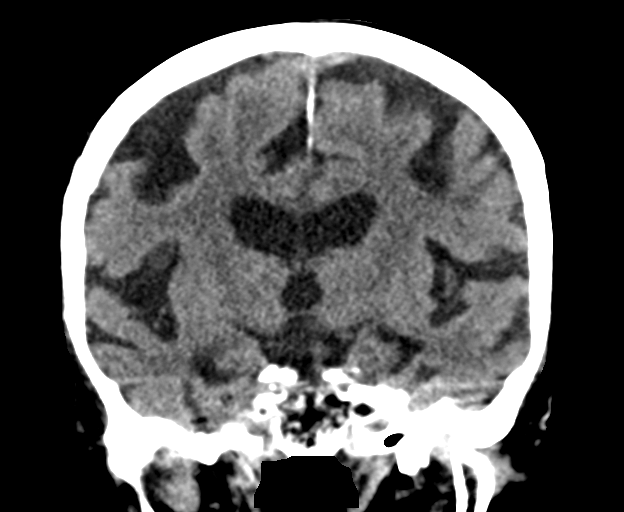
[im 43/58  brain]
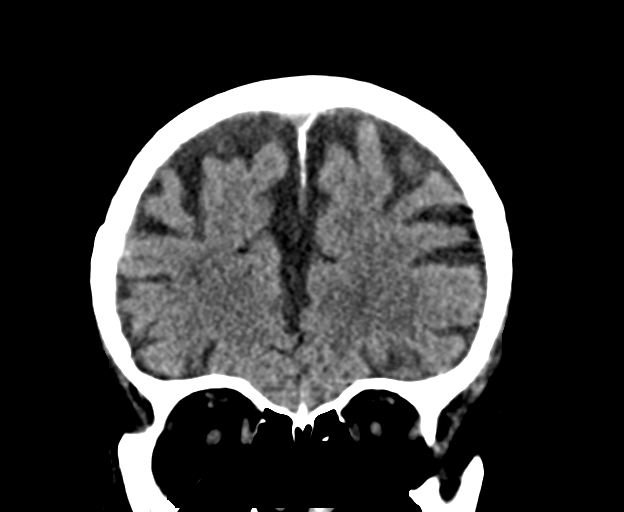

[Series 5: sagittal soft tissue · sagittal · 0.29mm/px · 2 of 51 slices shown]
[im 17/51  brain]
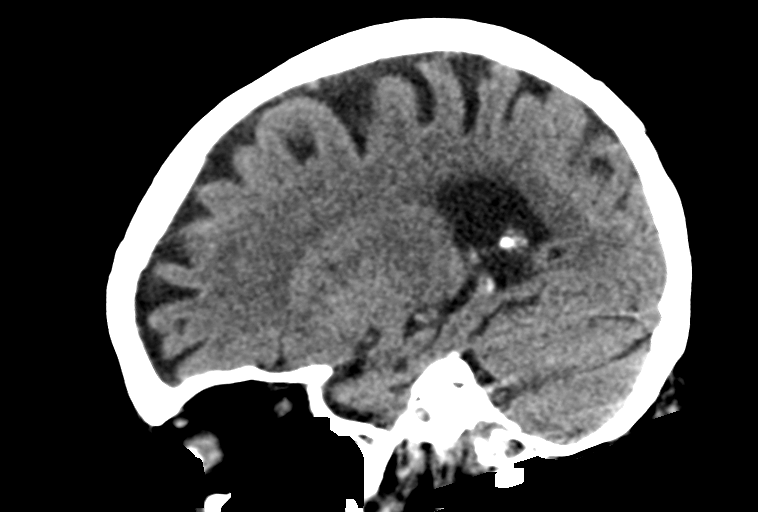
[im 34/51  brain]
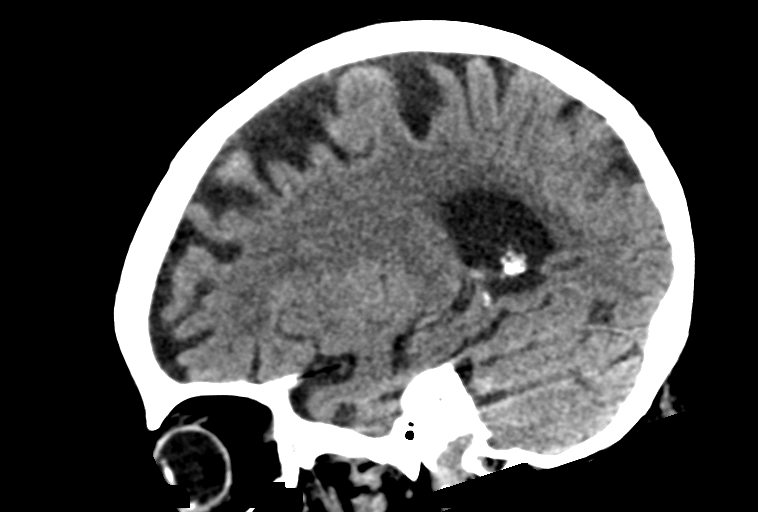

[14 of 47 positions shown; findings below may reference images not displayed]

FINDINGS: Brain: Generalized atrophy. No sign of recent infarction,
intra-axial mass lesion, hemorrhage, hydrocephalus or extra-axial
collection. Ordinary mild chronic small-vessel change of the white
matter, typical for age. Insignificant calcified 2 cm meningioma
projecting towards the left from the superior falx. No mass-effect
upon the brain.

Vascular: There is atherosclerotic calcification of the major
vessels at the base of the brain.

Skull: Negative

Sinuses/Orbits: Clear/normal

Other: None
IMPRESSION: No acute finding by CT. Generalized atrophy. Ordinary chronic
small-vessel change of the white matter, typical for age.

## 2021-01-08 IMAGING — DX DG CHEST 1V PORT
1 series · 1 of 1 positions shown · non-contrast
Comparison: None.

CLINICAL DATA: Preop

EXAM:
PORTABLE CHEST 1 VIEW

[chest ap]
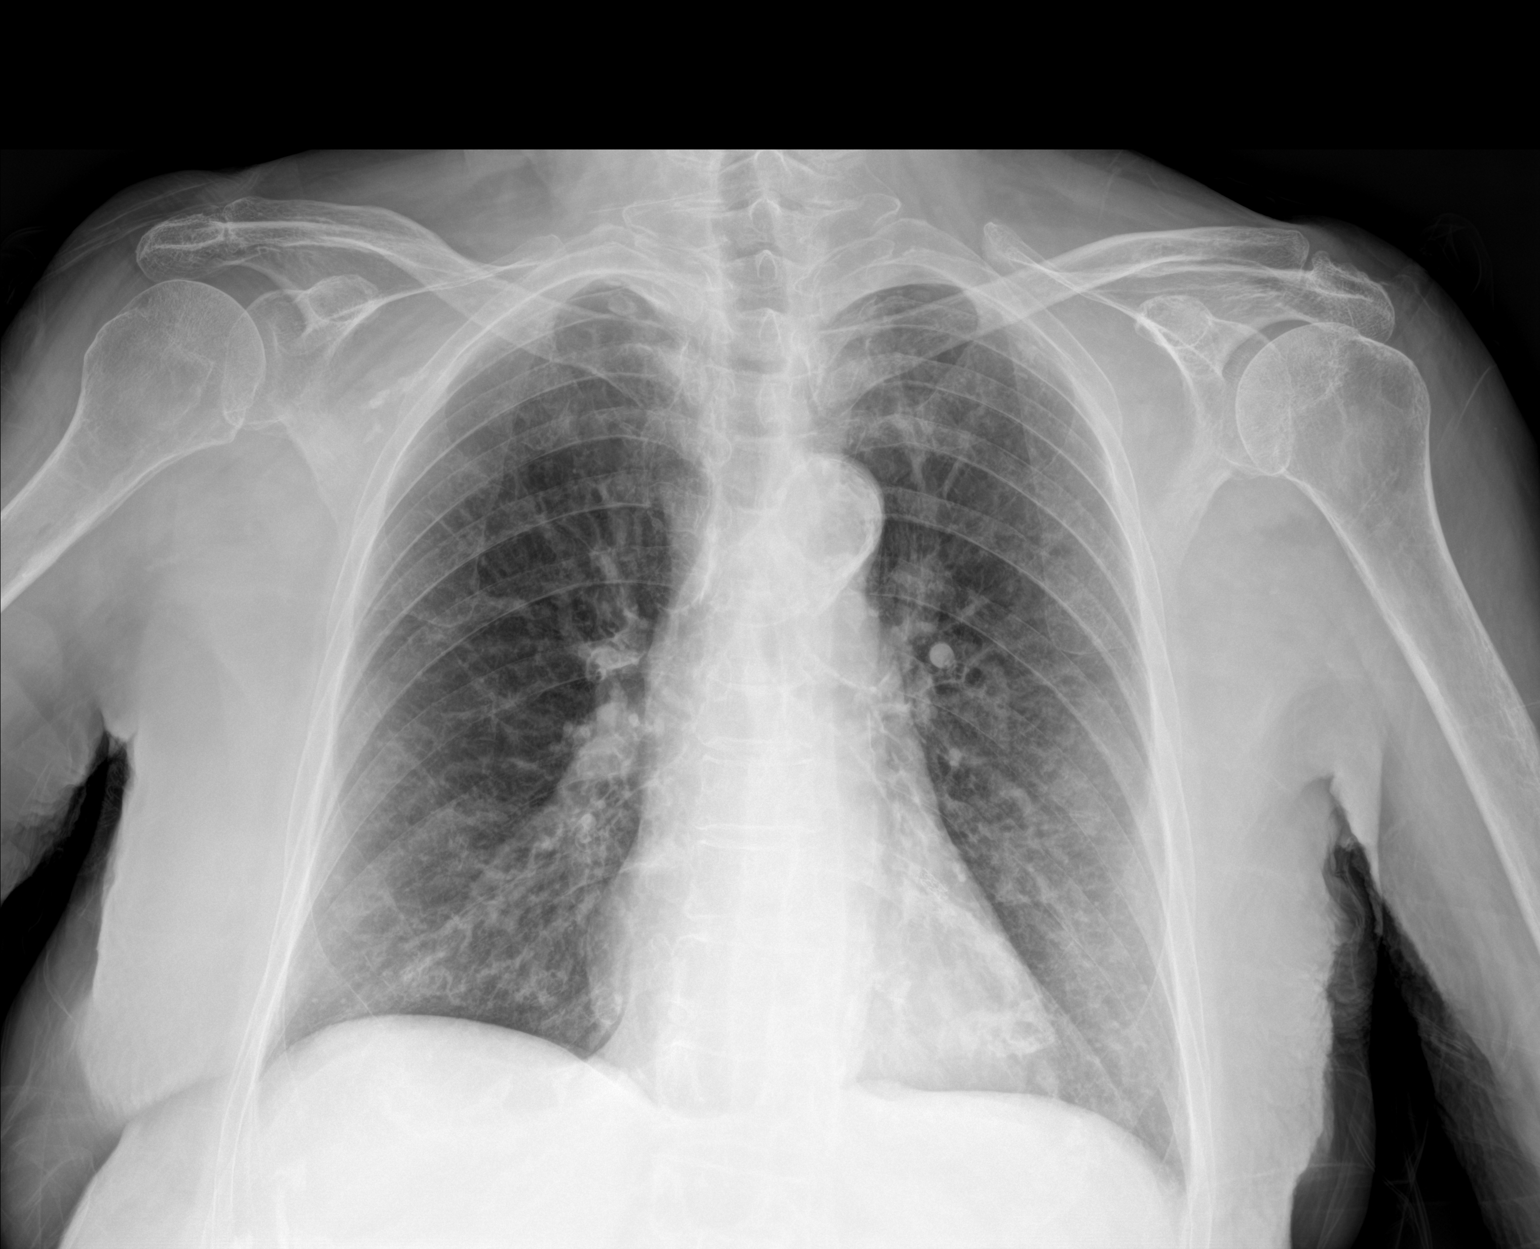

[1 of 1 positions shown; findings below may reference images not displayed]

FINDINGS: The heart size and mediastinal contours are within normal limits.
Aortic knob calcifications are seen. Mildly increased interstitial
markings seen at both lung bases as on the prior exam. No acute
osseous abnormality.
IMPRESSION: No acute cardiopulmonary process.

## 2021-09-21 DEATH — deceased
